# Patient Record
Sex: Female | Born: 1935
Health system: Southern US, Community
[De-identification: ages and names within clinical notes are randomized; demographics above are authoritative.]

## PROBLEM LIST (undated history)

## (undated) DIAGNOSIS — Z9889 Other specified postprocedural states: Secondary | ICD-10-CM

## (undated) DIAGNOSIS — G3184 Mild cognitive impairment, so stated: Secondary | ICD-10-CM

## (undated) DIAGNOSIS — I340 Nonrheumatic mitral (valve) insufficiency: Secondary | ICD-10-CM

## (undated) DIAGNOSIS — M1990 Unspecified osteoarthritis, unspecified site: Secondary | ICD-10-CM

## (undated) DIAGNOSIS — E049 Nontoxic goiter, unspecified: Secondary | ICD-10-CM

## (undated) DIAGNOSIS — M353 Polymyalgia rheumatica: Secondary | ICD-10-CM

## (undated) DIAGNOSIS — R413 Other amnesia: Secondary | ICD-10-CM

## (undated) DIAGNOSIS — I4819 Other persistent atrial fibrillation: Secondary | ICD-10-CM

## (undated) DIAGNOSIS — I350 Nonrheumatic aortic (valve) stenosis: Secondary | ICD-10-CM

## (undated) DIAGNOSIS — N952 Postmenopausal atrophic vaginitis: Secondary | ICD-10-CM

## (undated) DIAGNOSIS — E538 Deficiency of other specified B group vitamins: Secondary | ICD-10-CM

## (undated) DIAGNOSIS — R519 Headache, unspecified: Secondary | ICD-10-CM

## (undated) DIAGNOSIS — J45909 Unspecified asthma, uncomplicated: Secondary | ICD-10-CM

## (undated) DIAGNOSIS — I5189 Other ill-defined heart diseases: Secondary | ICD-10-CM

## (undated) DIAGNOSIS — R011 Cardiac murmur, unspecified: Secondary | ICD-10-CM

## (undated) DIAGNOSIS — G43101 Migraine with aura, not intractable, with status migrainosus: Secondary | ICD-10-CM

## (undated) HISTORY — DX: Mild cognitive impairment of uncertain or unknown etiology: G31.84

## (undated) HISTORY — DX: Other persistent atrial fibrillation: I48.19

## (undated) HISTORY — DX: Other amnesia: R41.3

## (undated) HISTORY — PX: ABDOMINAL HYSTERECTOMY: SHX81

## (undated) HISTORY — PX: LUMBAR DISC SURGERY: SHX700

## (undated) HISTORY — DX: Postmenopausal atrophic vaginitis: N95.2

## (undated) HISTORY — DX: Deficiency of other specified B group vitamins: E53.8

## (undated) HISTORY — PX: KNEE SURGERY: SHX244

## (undated) HISTORY — DX: Other specified postprocedural states: Z98.890

## (undated) HISTORY — DX: Polymyalgia rheumatica: M35.3

## (undated) HISTORY — DX: Nonrheumatic mitral (valve) insufficiency: I34.0

## (undated) HISTORY — DX: Nontoxic goiter, unspecified: E04.9

## (undated) HISTORY — DX: Other ill-defined heart diseases: I51.89

## (undated) HISTORY — PX: OTHER SURGICAL HISTORY: SHX169

## (undated) HISTORY — DX: Nonrheumatic aortic (valve) stenosis: I35.0

## (undated) HISTORY — PX: BREAST CYST EXCISION: SHX579

## (undated) HISTORY — PX: BREAST LUMPECTOMY: SHX2

## (undated) HISTORY — DX: Migraine with aura, not intractable, with status migrainosus: G43.101

## (undated) HISTORY — PX: LAPAROSCOPIC ENDOMETRIOSIS FULGURATION: SUR769

---

## 1998-06-03 ENCOUNTER — Emergency Department (HOSPITAL_COMMUNITY): Admission: EM | Admit: 1998-06-03 | Discharge: 1998-06-03 | Payer: Self-pay | Admitting: Emergency Medicine

## 1999-07-11 ENCOUNTER — Emergency Department (HOSPITAL_COMMUNITY): Admission: EM | Admit: 1999-07-11 | Discharge: 1999-07-11 | Payer: Self-pay | Admitting: *Deleted

## 1999-08-14 ENCOUNTER — Encounter: Payer: Self-pay | Admitting: Geriatric Medicine

## 1999-08-14 ENCOUNTER — Encounter: Admission: RE | Admit: 1999-08-14 | Discharge: 1999-08-14 | Payer: Self-pay | Admitting: Geriatric Medicine

## 1999-11-15 ENCOUNTER — Encounter: Payer: Self-pay | Admitting: Geriatric Medicine

## 1999-11-15 ENCOUNTER — Encounter: Admission: RE | Admit: 1999-11-15 | Discharge: 1999-11-15 | Payer: Self-pay | Admitting: Geriatric Medicine

## 2000-01-24 ENCOUNTER — Ambulatory Visit (HOSPITAL_COMMUNITY): Admission: RE | Admit: 2000-01-24 | Discharge: 2000-01-24 | Payer: Self-pay | Admitting: Orthopedic Surgery

## 2000-01-24 ENCOUNTER — Encounter: Payer: Self-pay | Admitting: Orthopedic Surgery

## 2000-09-16 ENCOUNTER — Encounter: Admission: RE | Admit: 2000-09-16 | Discharge: 2000-09-16 | Payer: Self-pay | Admitting: Geriatric Medicine

## 2000-09-16 ENCOUNTER — Encounter: Payer: Self-pay | Admitting: Geriatric Medicine

## 2000-10-29 ENCOUNTER — Encounter: Payer: Self-pay | Admitting: Orthopedic Surgery

## 2000-10-29 ENCOUNTER — Ambulatory Visit (HOSPITAL_COMMUNITY): Admission: RE | Admit: 2000-10-29 | Discharge: 2000-10-29 | Payer: Self-pay | Admitting: Orthopedic Surgery

## 2001-02-05 ENCOUNTER — Other Ambulatory Visit: Admission: RE | Admit: 2001-02-05 | Discharge: 2001-02-05 | Payer: Self-pay | Admitting: Obstetrics and Gynecology

## 2001-09-18 ENCOUNTER — Encounter: Admission: RE | Admit: 2001-09-18 | Discharge: 2001-09-18 | Payer: Self-pay | Admitting: Geriatric Medicine

## 2001-09-18 ENCOUNTER — Encounter: Payer: Self-pay | Admitting: Geriatric Medicine

## 2002-09-21 ENCOUNTER — Encounter: Admission: RE | Admit: 2002-09-21 | Discharge: 2002-09-21 | Payer: Self-pay | Admitting: Geriatric Medicine

## 2002-09-21 ENCOUNTER — Encounter: Payer: Self-pay | Admitting: Geriatric Medicine

## 2002-11-17 ENCOUNTER — Ambulatory Visit (HOSPITAL_COMMUNITY): Admission: RE | Admit: 2002-11-17 | Discharge: 2002-11-17 | Payer: Self-pay | Admitting: Gastroenterology

## 2003-07-28 ENCOUNTER — Encounter: Admission: RE | Admit: 2003-07-28 | Discharge: 2003-07-28 | Payer: Self-pay | Admitting: General Surgery

## 2003-08-03 ENCOUNTER — Ambulatory Visit (HOSPITAL_BASED_OUTPATIENT_CLINIC_OR_DEPARTMENT_OTHER): Admission: RE | Admit: 2003-08-03 | Discharge: 2003-08-03 | Payer: Self-pay | Admitting: General Surgery

## 2003-08-17 ENCOUNTER — Ambulatory Visit (HOSPITAL_BASED_OUTPATIENT_CLINIC_OR_DEPARTMENT_OTHER): Admission: RE | Admit: 2003-08-17 | Discharge: 2003-08-17 | Payer: Self-pay | Admitting: General Surgery

## 2003-08-17 ENCOUNTER — Encounter (INDEPENDENT_AMBULATORY_CARE_PROVIDER_SITE_OTHER): Payer: Self-pay | Admitting: Specialist

## 2003-08-17 ENCOUNTER — Ambulatory Visit (HOSPITAL_COMMUNITY): Admission: RE | Admit: 2003-08-17 | Discharge: 2003-08-17 | Payer: Self-pay | Admitting: General Surgery

## 2004-08-02 ENCOUNTER — Encounter: Admission: RE | Admit: 2004-08-02 | Discharge: 2004-08-02 | Payer: Self-pay | Admitting: Obstetrics and Gynecology

## 2005-10-15 ENCOUNTER — Encounter: Admission: RE | Admit: 2005-10-15 | Discharge: 2005-10-15 | Payer: Self-pay | Admitting: Geriatric Medicine

## 2005-12-27 ENCOUNTER — Other Ambulatory Visit: Admission: RE | Admit: 2005-12-27 | Discharge: 2005-12-27 | Payer: Self-pay | Admitting: Obstetrics and Gynecology

## 2006-12-04 ENCOUNTER — Encounter: Admission: RE | Admit: 2006-12-04 | Discharge: 2006-12-04 | Payer: Self-pay | Admitting: Geriatric Medicine

## 2007-12-08 ENCOUNTER — Encounter: Admission: RE | Admit: 2007-12-08 | Discharge: 2007-12-08 | Payer: Self-pay | Admitting: Geriatric Medicine

## 2009-03-09 ENCOUNTER — Encounter: Admission: RE | Admit: 2009-03-09 | Discharge: 2009-03-09 | Payer: Self-pay | Admitting: Geriatric Medicine

## 2010-04-22 ENCOUNTER — Encounter: Payer: Self-pay | Admitting: Geriatric Medicine

## 2010-05-25 ENCOUNTER — Other Ambulatory Visit: Payer: Self-pay | Admitting: Geriatric Medicine

## 2010-05-25 DIAGNOSIS — Z1231 Encounter for screening mammogram for malignant neoplasm of breast: Secondary | ICD-10-CM

## 2010-06-05 ENCOUNTER — Ambulatory Visit
Admission: RE | Admit: 2010-06-05 | Discharge: 2010-06-05 | Disposition: A | Discharge status: Home / Self Care | Payer: BC Managed Care – PPO | Source: Ambulatory Visit | Attending: Geriatric Medicine | Admitting: Geriatric Medicine

## 2010-06-05 DIAGNOSIS — Z1231 Encounter for screening mammogram for malignant neoplasm of breast: Secondary | ICD-10-CM

## 2010-08-17 NOTE — Op Note (Signed)
NAME:  Lindsay Ware, Lindsay Ware                            ACCOUNT NO.:  [id number]   MEDICAL RECORD NO.:  [id number]                   PATIENT TYPE:  AMB   LOCATION:  DSC                                  FACILITY:  [facility]   PHYSICIAN:  Angelia Mould. Derrell Lolling, M.D.             DATE OF BIRTH:  1935-09-26   DATE OF PROCEDURE:  08/17/2003  DATE OF DISCHARGE:                                 OPERATIVE REPORT   PREOPERATIVE DIAGNOSIS:  Right breast mass.   POSTOPERATIVE DIAGNOSIS:  Right breast mass.   PROCEDURE:  Right breast biopsy.   SURGEON:  Angelia Mould. Derrell Lolling, M.D.   OPERATIVE INDICATIONS:  This is a 75 year old white female who has noticed a  lump in her right breast at the 9 o'clock position for some time.  This has  actually been infected and has drained in the past.  This will respond to  antibiotics.  Her mammograms are negative.  On exam, in the right breast at  the 9 o'clock position there is a 1.5 cm subcutaneous mass quite firm. This  suggests a benign cyst. It is not inflamed. She is brought to the operating  room for excision of this mass.   OPERATIVE TECHNIQUE:  The patient was placed supine on the operating table.  She was monitored and sedated by the anesthesia department. The right wrist  was prepped and draped in a sterile fashion.  Xylocaine 1% with epinephrine  was used as a local infiltration anesthetic.  At the 9 o'clock position of  the right breast laterally the mass was identified. A transverse, narrow,  elliptical incision was made.  The cyst and some of the superficial  subcutaneous tissue was excised and sent for routine histology.   The wound was copiously irrigated with saline.  Hemostasis was excellent and  achieved with electrocautery.  The skin was closed with a running  subcuticular suture of 4-0 Monocryl and Steri-Strips.  Clean bandages were  placed and the patient was taken to the recovery room in stable condition.   ESTIMATED BLOOD LOSS:  3 cc.   COMPLICATIONS:  None.   SPONGE NEEDLE AND INSTRUMENT COUNTS:  Correct.                                               Angelia Mould. Derrell Lolling, M.D.   HMI/MEDQ  D:  08/17/2003  T:  08/17/2003  Job:  [id number]   cc:   Hal T. Stoneking, M.D.  [address] [place], [place] [address]  Fax: [phone]

## 2010-08-17 NOTE — Op Note (Signed)
   NAMEDUNG, Lindsay Ware                            ACCOUNT NO.:  [id number]   MEDICAL RECORD NO.:  [id number]                   PATIENT TYPE:  AMB   LOCATION:  ENDO                                 FACILITY:  [facility]   PHYSICIAN:  Danise Edge, M.D.                DATE OF BIRTH:  1935-12-24   DATE OF PROCEDURE:  11/17/2002  DATE OF DISCHARGE:                                 OPERATIVE REPORT   PROCEDURE:  Screening colonoscopy.   INDICATIONS FOR PROCEDURE:  Lindsay Ware is a 75 year old female born  Aug 18, 1935. Lindsay Ware is scheduled to undergo her first screening  colonoscopy with polypectomy to prevent colon cancer.   ENDOSCOPIST:  Charolett Bumpers, M.D.   PREMEDICATION:  Versed 7 mg, Demerol 60 mg .   DESCRIPTION OF PROCEDURE:  After obtaining informed consent, Lindsay Ware was  placed in the left lateral decubitus position. I administered intravenous  Demerol and intravenous Versed to achieve conscious sedation for the  procedure. The patient's blood pressure, oxygen saturation and cardiac  rhythm were monitored throughout the procedure and documented in the medical  record.   Anal inspection was normal. Digital rectal exam was normal. The Olympus  pediatric adjustable colonoscope was introduced into the rectum and advanced  to the cecum. Colonic preparation for the exam today was satisfactory.   RECTUM:  Normal.   SIGMOID COLON AND DESCENDING COLON:  Normal.   SPLENIC FLEXURE:  Normal.   TRANSVERSE COLON:  Normal.   HEPATIC FLEXURE:  Normal.   ASCENDING COLON:  Normal.   CECUM AND ILEOCECAL VALVE:  Normal.    ASSESSMENT:  Normal screening proctocolonoscopy to the cecum. No endoscopic  evidence for the presence of colorectal neoplasia.                                               Danise Edge, M.D.    MJ/MEDQ  D:  11/17/2002  T:  11/17/2002  Job:  [id number]   cc:   Hal T. Stoneking, M.D.  [address] [place], [place] [address]  Fax: [phone]

## 2010-08-20 ENCOUNTER — Other Ambulatory Visit: Payer: Self-pay | Admitting: Geriatric Medicine

## 2010-08-20 DIAGNOSIS — E049 Nontoxic goiter, unspecified: Secondary | ICD-10-CM

## 2010-08-21 ENCOUNTER — Ambulatory Visit
Admission: RE | Admit: 2010-08-21 | Discharge: 2010-08-21 | Disposition: A | Discharge status: Home / Self Care | Payer: BC Managed Care – PPO | Source: Ambulatory Visit | Attending: Geriatric Medicine | Admitting: Geriatric Medicine

## 2010-08-21 DIAGNOSIS — E049 Nontoxic goiter, unspecified: Secondary | ICD-10-CM

## 2011-05-08 ENCOUNTER — Other Ambulatory Visit: Payer: Self-pay | Admitting: Dermatology

## 2011-06-21 ENCOUNTER — Other Ambulatory Visit: Payer: Self-pay | Admitting: Geriatric Medicine

## 2011-06-21 DIAGNOSIS — Z1231 Encounter for screening mammogram for malignant neoplasm of breast: Secondary | ICD-10-CM

## 2011-06-26 ENCOUNTER — Ambulatory Visit: Payer: BC Managed Care – PPO

## 2011-06-27 ENCOUNTER — Ambulatory Visit: Payer: BC Managed Care – PPO

## 2011-07-01 ENCOUNTER — Ambulatory Visit: Payer: BC Managed Care – PPO

## 2011-07-04 ENCOUNTER — Ambulatory Visit
Admission: RE | Admit: 2011-07-04 | Discharge: 2011-07-04 | Disposition: A | Discharge status: Home / Self Care | Payer: BC Managed Care – PPO | Source: Ambulatory Visit | Attending: Geriatric Medicine | Admitting: Geriatric Medicine

## 2011-07-04 DIAGNOSIS — Z1231 Encounter for screening mammogram for malignant neoplasm of breast: Secondary | ICD-10-CM

## 2011-09-16 ENCOUNTER — Other Ambulatory Visit: Payer: Self-pay | Admitting: Geriatric Medicine

## 2011-09-16 DIAGNOSIS — E049 Nontoxic goiter, unspecified: Secondary | ICD-10-CM

## 2011-09-18 ENCOUNTER — Other Ambulatory Visit: Payer: BC Managed Care – PPO

## 2011-09-23 ENCOUNTER — Ambulatory Visit
Admission: RE | Admit: 2011-09-23 | Discharge: 2011-09-23 | Disposition: A | Discharge status: Home / Self Care | Payer: BC Managed Care – PPO | Source: Ambulatory Visit | Attending: Geriatric Medicine | Admitting: Geriatric Medicine

## 2011-09-23 DIAGNOSIS — E049 Nontoxic goiter, unspecified: Secondary | ICD-10-CM

## 2012-08-25 ENCOUNTER — Ambulatory Visit: Payer: Self-pay | Admitting: Podiatry

## 2012-09-21 ENCOUNTER — Other Ambulatory Visit: Payer: Self-pay | Admitting: Geriatric Medicine

## 2012-09-21 DIAGNOSIS — E049 Nontoxic goiter, unspecified: Secondary | ICD-10-CM

## 2012-09-25 ENCOUNTER — Ambulatory Visit
Admission: RE | Admit: 2012-09-25 | Discharge: 2012-09-25 | Disposition: A | Discharge status: Home / Self Care | Payer: BC Managed Care – PPO | Source: Ambulatory Visit | Attending: Geriatric Medicine | Admitting: Geriatric Medicine

## 2012-09-25 DIAGNOSIS — E049 Nontoxic goiter, unspecified: Secondary | ICD-10-CM

## 2012-11-02 ENCOUNTER — Other Ambulatory Visit: Payer: Self-pay

## 2012-11-02 DIAGNOSIS — Z1231 Encounter for screening mammogram for malignant neoplasm of breast: Secondary | ICD-10-CM

## 2012-11-05 ENCOUNTER — Ambulatory Visit
Admission: RE | Admit: 2012-11-05 | Discharge: 2012-11-05 | Disposition: A | Discharge status: Home / Self Care | Payer: BC Managed Care – PPO | Source: Ambulatory Visit

## 2012-11-05 DIAGNOSIS — Z1231 Encounter for screening mammogram for malignant neoplasm of breast: Secondary | ICD-10-CM

## 2013-07-08 ENCOUNTER — Other Ambulatory Visit: Payer: Self-pay | Admitting: Gastroenterology

## 2013-08-31 ENCOUNTER — Encounter: Payer: Self-pay | Admitting: Podiatry

## 2013-08-31 ENCOUNTER — Ambulatory Visit (INDEPENDENT_AMBULATORY_CARE_PROVIDER_SITE_OTHER): Payer: BC Managed Care – PPO | Admitting: Podiatry

## 2013-08-31 VITALS — BP 138/72 | HR 57 | Ht 67.0 in | Wt 146.0 lb

## 2013-08-31 DIAGNOSIS — B351 Tinea unguium: Secondary | ICD-10-CM | POA: Insufficient documentation

## 2013-08-31 DIAGNOSIS — M79606 Pain in leg, unspecified: Secondary | ICD-10-CM | POA: Insufficient documentation

## 2013-08-31 DIAGNOSIS — M79609 Pain in unspecified limb: Secondary | ICD-10-CM

## 2013-08-31 DIAGNOSIS — L03039 Cellulitis of unspecified toe: Secondary | ICD-10-CM | POA: Insufficient documentation

## 2013-08-31 MED ORDER — TERBINAFINE HCL 250 MG PO TABS: 250.0000 mg | ORAL_TABLET | Freq: Every day | ORAL | Status: DC

## 2013-08-31 NOTE — Patient Instructions (Signed)
Seen for painful infected nail 3rd right and hypertrophic nail.  Nail avulsed 3rd right. Soak in Epson salt water till the nail gets dry.  Take Lamisil for the next 3 weeks.  Return in 3 month or sooner if needed.

## 2013-08-31 NOTE — Progress Notes (Signed)
Subjective: 78 year old female presents with painful nail 3rd right. Has been hurting for several days.  Also having problem with discolored and hypertrophic nails.   Objective: Discolored hypertrophic and inflamed nail 3rd right. Thick dystrophic nail with fungal debris both great toes. Neurovascular status are within normal.  Assessment: Infected nail right 3rd. Mycotic nails both great toes. Painful feet.  Plan: Reviewed findings. Right 3rd toe nail avulsed. Pus drained. Cleansed with Iodine and Amerigel dressing applied. Soaking instruction given. All other nails debrided. Stay in Lamisil for 3 weeks.

## 2013-09-22 ENCOUNTER — Other Ambulatory Visit: Payer: Self-pay | Admitting: Geriatric Medicine

## 2013-09-22 DIAGNOSIS — E049 Nontoxic goiter, unspecified: Secondary | ICD-10-CM

## 2013-09-23 ENCOUNTER — Ambulatory Visit
Admission: RE | Admit: 2013-09-23 | Discharge: 2013-09-23 | Disposition: A | Discharge status: Home / Self Care | Payer: 59 | Source: Ambulatory Visit | Attending: Geriatric Medicine | Admitting: Geriatric Medicine

## 2013-09-23 DIAGNOSIS — E049 Nontoxic goiter, unspecified: Secondary | ICD-10-CM

## 2013-10-18 ENCOUNTER — Other Ambulatory Visit: Payer: Self-pay

## 2013-10-18 DIAGNOSIS — Z1231 Encounter for screening mammogram for malignant neoplasm of breast: Secondary | ICD-10-CM

## 2013-11-08 ENCOUNTER — Ambulatory Visit: Payer: BC Managed Care – PPO

## 2013-11-12 ENCOUNTER — Ambulatory Visit
Admission: RE | Admit: 2013-11-12 | Discharge: 2013-11-12 | Disposition: A | Discharge status: Home / Self Care | Payer: 59 | Source: Ambulatory Visit

## 2013-11-12 DIAGNOSIS — Z1231 Encounter for screening mammogram for malignant neoplasm of breast: Secondary | ICD-10-CM

## 2013-12-01 ENCOUNTER — Ambulatory Visit: Payer: PRIVATE HEALTH INSURANCE | Admitting: Podiatry

## 2013-12-09 ENCOUNTER — Ambulatory Visit: Payer: PRIVATE HEALTH INSURANCE

## 2013-12-21 ENCOUNTER — Encounter: Payer: Self-pay | Admitting: Podiatry

## 2013-12-21 ENCOUNTER — Ambulatory Visit (INDEPENDENT_AMBULATORY_CARE_PROVIDER_SITE_OTHER): Payer: PRIVATE HEALTH INSURANCE | Admitting: Podiatry

## 2013-12-21 VITALS — BP 131/64 | HR 66

## 2013-12-21 DIAGNOSIS — M79609 Pain in unspecified limb: Secondary | ICD-10-CM

## 2013-12-21 DIAGNOSIS — M79606 Pain in leg, unspecified: Secondary | ICD-10-CM

## 2013-12-21 DIAGNOSIS — B351 Tinea unguium: Secondary | ICD-10-CM

## 2013-12-21 NOTE — Progress Notes (Signed)
Subjective: 78 year old female presents with painful nail 3rd right. Has been hurting for several days.  Also having problem with discolored and hypertrophic nails.   Objective: Discolored hypertrophic and inflamed nail 3rd right. Thick dystrophic nail with fungal debris both great toes. Neurovascular status are within normal.  Assessment: Infected nail right 3rd. Mycotic nails both great toes. Painful feet.  Plan: Reviewed findings. Right 3rd toe nail avulsed. Pus drained. Cleansed with Iodine and Amerigel dressing applied. Soaking instruction given. All other nails debrided. Stay in Lamisil for 3 weeks.   

## 2013-12-21 NOTE — Patient Instructions (Signed)
Seen for deformed nails. Affected nails debrided. Return in 3 months or as needed.

## 2014-03-22 ENCOUNTER — Ambulatory Visit: Payer: PRIVATE HEALTH INSURANCE | Admitting: Podiatry

## 2014-04-05 ENCOUNTER — Encounter: Payer: Self-pay | Admitting: Podiatry

## 2014-04-05 ENCOUNTER — Ambulatory Visit (INDEPENDENT_AMBULATORY_CARE_PROVIDER_SITE_OTHER): Payer: Medicare Other | Admitting: Podiatry

## 2014-04-05 VITALS — BP 113/66 | HR 65

## 2014-04-05 DIAGNOSIS — B351 Tinea unguium: Secondary | ICD-10-CM

## 2014-04-05 DIAGNOSIS — M79606 Pain in leg, unspecified: Secondary | ICD-10-CM

## 2014-04-05 NOTE — Patient Instructions (Signed)
Seen for hypertrophic nails. All nails debrided. Return in 3 months or as needed.  

## 2014-04-05 NOTE — Progress Notes (Signed)
Subjective:  79 year old female presents for routine foot care. Been caring for fungal nails and sees some improvement.   Objective: Discolored hypertrophic and inflamed nail 3rd right.  Thick dystrophic nail with fungal debris both great toes.  Neurovascular status are within normal.   Assessment: Mycotic nails both great toes.  Painful feet.   Plan: Reviewed findings.  All other nails debrided.

## 2014-07-05 ENCOUNTER — Ambulatory Visit: Payer: Medicare Other | Admitting: Podiatry

## 2014-09-28 ENCOUNTER — Other Ambulatory Visit: Payer: Self-pay | Admitting: Geriatric Medicine

## 2014-09-28 DIAGNOSIS — E049 Nontoxic goiter, unspecified: Secondary | ICD-10-CM

## 2014-10-05 ENCOUNTER — Ambulatory Visit
Admission: RE | Admit: 2014-10-05 | Discharge: 2014-10-05 | Disposition: A | Discharge status: Home / Self Care | Payer: Medicare Other | Source: Ambulatory Visit | Attending: Geriatric Medicine | Admitting: Geriatric Medicine

## 2014-10-05 DIAGNOSIS — E049 Nontoxic goiter, unspecified: Secondary | ICD-10-CM

## 2014-10-12 ENCOUNTER — Other Ambulatory Visit: Payer: Self-pay | Admitting: Geriatric Medicine

## 2014-10-12 ENCOUNTER — Ambulatory Visit: Payer: Medicare Other | Admitting: Podiatry

## 2014-10-12 DIAGNOSIS — E049 Nontoxic goiter, unspecified: Secondary | ICD-10-CM

## 2014-10-14 ENCOUNTER — Other Ambulatory Visit: Payer: Medicare Other

## 2014-10-28 ENCOUNTER — Ambulatory Visit (INDEPENDENT_AMBULATORY_CARE_PROVIDER_SITE_OTHER): Payer: Medicare Other | Admitting: Podiatry

## 2014-10-28 ENCOUNTER — Encounter: Payer: Self-pay | Admitting: Podiatry

## 2014-10-28 VITALS — BP 136/74 | HR 60

## 2014-10-28 DIAGNOSIS — M79673 Pain in unspecified foot: Secondary | ICD-10-CM

## 2014-10-28 DIAGNOSIS — M79606 Pain in leg, unspecified: Secondary | ICD-10-CM

## 2014-10-28 DIAGNOSIS — B351 Tinea unguium: Secondary | ICD-10-CM | POA: Diagnosis not present

## 2014-10-28 NOTE — Progress Notes (Signed)
Subjective:  79 year old female presents for routine foot care.   Objective: Discolored hypertrophic and inflamed nail 3rd right.  Thick dystrophic nail with fungal debris both great toes.  Neurovascular status are within normal.   Assessment: Mycotic nails both great toes.  Painful feet.   Plan: Reviewed findings.  All other nails debrided.

## 2014-10-28 NOTE — Patient Instructions (Signed)
Seen for hypertrophic nails. All nails debrided. Return in 3 months or as needed.  

## 2014-11-24 ENCOUNTER — Other Ambulatory Visit: Payer: Self-pay

## 2014-11-24 DIAGNOSIS — Z1231 Encounter for screening mammogram for malignant neoplasm of breast: Secondary | ICD-10-CM

## 2015-01-02 ENCOUNTER — Ambulatory Visit
Admission: RE | Admit: 2015-01-02 | Discharge: 2015-01-02 | Disposition: A | Discharge status: Home / Self Care | Payer: Medicare Other | Source: Ambulatory Visit

## 2015-01-02 DIAGNOSIS — Z1231 Encounter for screening mammogram for malignant neoplasm of breast: Secondary | ICD-10-CM

## 2015-04-21 ENCOUNTER — Ambulatory Visit (INDEPENDENT_AMBULATORY_CARE_PROVIDER_SITE_OTHER): Payer: Medicare Other | Admitting: Family Medicine

## 2015-04-21 VITALS — BP 144/80 | HR 67 | Temp 98.2°F | Resp 16 | Ht 66.0 in | Wt 146.2 lb

## 2015-04-21 DIAGNOSIS — J019 Acute sinusitis, unspecified: Secondary | ICD-10-CM | POA: Diagnosis not present

## 2015-04-21 MED ORDER — AMOXICILLIN-POT CLAVULANATE 875-125 MG PO TABS: 1.0000 | ORAL_TABLET | Freq: Two times a day (BID) | ORAL | Status: DC

## 2015-04-21 NOTE — Progress Notes (Signed)
Subjective:  By signing my name below, I, Rawaa Al Rifaie, attest that this documentation has been prepared under the direction and in the presence of Norberto Sorenson, MD.  Broadus John, Medical Scribe. 04/21/2015.  3:14 PM.    Patient ID: Lindsay Ware, female    DOB: 09-06-1935, 80 y.o.   MRN: [id number]  Chief Complaint  Patient presents with  . Sinusitis    drainage is yellow, runny nose x almost 2 weeks  . Cough    HPI HPI Comments: Lindsay Ware is a 80 y.o. female who presents to [facility] complaining of possible sinusitis, gradual onset about 9 days ago.  Pt reports that the symptoms started with rhinorrhea, that then developed into head congestion with yellow colored drainage,with ear ache, cough, and fatigue. She took [employer], Zyrtec, the Emergen C, and the neti pot. She denies sleep disturbance secondary to the symptoms. Pt states that she is currently on no other medications other than Vitamin D and some joint meds. She notes that she keeps active by going to the gym regularly.   Pt reports that she has been aware of her irregular heart beats and heart murmur, however she was not advised to take any actions for them.    Patient Active Problem List   Diagnosis Date Noted  . Onychia and paronychia of toe 08/31/2013  . Onychomycosis 08/31/2013  . Pain in lower limb 08/31/2013   History reviewed. No pertinent past medical history. History reviewed. No pertinent past surgical history. Allergies  Allergen Reactions  . Sulfa Antibiotics    Prior to Admission medications   Medication Sig Start Date Ware Date Taking? Authorizing Provider  cetirizine (ZYRTEC) 10 MG tablet Take 10 mg by mouth daily.   Yes Historical Provider, MD  cholecalciferol (VITAMIN D) 1000 units tablet Take 1,000 Units by mouth daily.   Yes Historical Provider, MD  dextromethorphan-guaiFENesin (MUCINEX DM) 30-600 MG 12hr tablet Take 1 tablet by mouth 2 (two) times daily.   Yes  Historical Provider, MD  fluticasone (FLONASE) 50 MCG/ACT nasal spray Place into both nostrils daily.   Yes Historical Provider, MD  Misc Natural Products (OSTEO BI-FLEX ADV DOUBLE ST) TABS Take by mouth 2 (two) times daily.   Yes Historical Provider, MD   Social History   Social History  . Marital Status: Married    Spouse Name: N/A  . Number of Children: N/A  . Years of Education: N/A   Occupational History  . Not on file.   Social History Main Topics  . Smoking status: Former Games developer  . Smokeless tobacco: Never Used  . Alcohol Use: 1.8 oz/week    0 Standard drinks or equivalent, 3 Glasses of wine per week  . Drug Use: No  . Sexual Activity: Not on file   Other Topics Concern  . Not on file   Social History Narrative     Review of Systems  Constitutional: Positive for fatigue. Negative for fever, activity change and appetite change.  HENT: Positive for congestion, ear pain, postnasal drip, rhinorrhea and sinus pressure.   Respiratory: Positive for cough. Negative for shortness of breath and wheezing.   Gastrointestinal: Negative for vomiting.  Neurological: Positive for headaches.  Hematological: Negative for adenopathy.  Psychiatric/Behavioral: Negative for sleep disturbance.      Objective:   Physical Exam  Constitutional: She is oriented to person, place, and time. She appears well-developed and well-nourished. No distress.  HENT:  Head: Normocephalic and  atraumatic.  Right Ear: External ear normal.  Left Ear: External ear normal.  Nose: Nose normal.  Mouth/Throat: Posterior oropharyngeal edema (mild) present.  nares are a little edematous  Eyes: EOM are normal. Pupils are equal, round, and reactive to light.  Neck: Neck supple.  Question of a thyroid goiter.  Cardiovascular: Normal rate.  An irregular rhythm present.  Murmur (2/6, left upper sternal border) heard. Pulmonary/Chest: Effort normal.  Neurological: She is alert and oriented to person, place, and  time. No cranial nerve deficit.  Skin: Skin is warm and dry.  Psychiatric: She has a normal mood and affect. Her behavior is normal.  Nursing note and vitals reviewed.  BP 144/80 mmHg  Pulse 67  Temp(Src) 98.2 F (36.8 C) (Oral)  Resp 16  Ht  (1.676 m)  Wt 146 lb 3.2 oz (66.316 kg)  BMI 23.61 kg/m2  SpO2 98%     Assessment & Plan:   1. Acute sinusitis, recurrence not specified, unspecified location     Meds ordered this encounter  Medications  . Misc Natural Products (OSTEO BI-FLEX ADV DOUBLE ST) TABS    Sig: Take by mouth 2 (two) times daily.  . cholecalciferol (VITAMIN D) 1000 units tablet    Sig: Take 1,000 Units by mouth daily.  Marland Kitchen DISCONTD: cetirizine (ZYRTEC) 10 MG tablet    Sig: Take 10 mg by mouth daily.  Marland Kitchen dextromethorphan-guaiFENesin (MUCINEX DM) 30-600 MG 12hr tablet    Sig: Take 1 tablet by mouth 2 (two) times daily.  . fluticasone (FLONASE) 50 MCG/ACT nasal spray    Sig: Place into both nostrils daily.  Marland Kitchen amoxicillin-clavulanate (AUGMENTIN) 875-125 MG tablet    Sig: Take 1 tablet by mouth 2 (two) times daily.    Dispense:  20 tablet    Refill:  0    I personally performed the services described in this documentation, which was scribed in my presence. The recorded information has been reviewed and considered, and addended by me as needed.  Norberto Sorenson, MD MPH

## 2015-04-21 NOTE — Patient Instructions (Signed)
Hot showers or breathing in steam may help loosen the congestion.  Using a netti pot or sinus rinse is also likely to help you feel better and keep this from progressing.  Use the nasal saline spray as needed throughout the day and use the fluticasone nasal spray every night before bed for at least 2 weeks.  I recommend augmenting generic mucinex to help you move out the congestion.  If no improvement or you are getting worse, come back as you might need a course of steroids but hopefully with all of the above, you can avoid it.  Sinusitis, Adult Sinusitis is redness, soreness, and inflammation of the paranasal sinuses. Paranasal sinuses are air pockets within the bones of your face. They are located beneath your eyes, in the middle of your forehead, and above your eyes. In healthy paranasal sinuses, mucus is able to drain out, and air is able to circulate through them by way of your nose. However, when your paranasal sinuses are inflamed, mucus and air can become trapped. This can allow bacteria and other germs to grow and cause infection. Sinusitis can develop quickly and last only a short time (acute) or continue over a long period (chronic). Sinusitis that lasts for more than 12 weeks is considered chronic. CAUSES Causes of sinusitis include:  Allergies.  Structural abnormalities, such as displacement of the cartilage that separates your nostrils (deviated septum), which can decrease the air flow through your nose and sinuses and affect sinus drainage.  Functional abnormalities, such as when the small hairs (cilia) that line your sinuses and help remove mucus do not work properly or are not present. SIGNS AND SYMPTOMS Symptoms of acute and chronic sinusitis are the same. The primary symptoms are pain and pressure around the affected sinuses. Other symptoms include:  Upper toothache.  Earache.  Headache.  Bad breath.  Decreased sense of smell and taste.  A cough, which worsens when you  are lying flat.  Fatigue.  Fever.  Thick drainage from your nose, which often is green and may contain pus (purulent).  Swelling and warmth over the affected sinuses. DIAGNOSIS Your health care provider will perform a physical exam. During your exam, your health care provider may perform any of the following to help determine if you have acute sinusitis or chronic sinusitis:  Look in your nose for signs of abnormal growths in your nostrils (nasal polyps).  Tap over the affected sinus to check for signs of infection.  View the inside of your sinuses using an imaging device that has a light attached (endoscope). If your health care provider suspects that you have chronic sinusitis, one or more of the following tests may be recommended:  Allergy tests.  Nasal culture. A sample of mucus is taken from your nose, sent to a lab, and screened for bacteria.  Nasal cytology. A sample of mucus is taken from your nose and examined by your health care provider to determine if your sinusitis is related to an allergy. TREATMENT Most cases of acute sinusitis are related to a viral infection and will resolve on their own within 10 days. Sometimes, medicines are prescribed to help relieve symptoms of both acute and chronic sinusitis. These may include pain medicines, decongestants, nasal steroid sprays, or saline sprays. However, for sinusitis related to a bacterial infection, your health care provider will prescribe antibiotic medicines. These are medicines that will help kill the bacteria causing the infection. Rarely, sinusitis is caused by a fungal infection. In these cases, your  health care provider will prescribe antifungal medicine. For some cases of chronic sinusitis, surgery is needed. Generally, these are cases in which sinusitis recurs more than 3 times per year, despite other treatments. HOME CARE INSTRUCTIONS  Drink plenty of water. Water helps thin the mucus so your sinuses can drain more  easily.  Use a humidifier.  Inhale steam 3-4 times a day (for example, sit in the bathroom with the shower running).  Apply a warm, moist washcloth to your face 3-4 times a day, or as directed by your health care provider.  Use saline nasal sprays to help moisten and clean your sinuses.  Take medicines only as directed by your health care provider.  If you were prescribed either an antibiotic or antifungal medicine, finish it all even if you start to feel better. SEEK IMMEDIATE MEDICAL CARE IF:  You have increasing pain or severe headaches.  You have nausea, vomiting, or drowsiness.  You have swelling around your face.  You have vision problems.  You have a stiff neck.  You have difficulty breathing.   This information is not intended to replace advice given to you by your health care provider. Make sure you discuss any questions you have with your health care provider.   Document Released: 03/18/2005 Document Revised: 04/08/2014 Document Reviewed: 04/02/2011 Elsevier Interactive Patient Education [employer].

## 2015-07-28 ENCOUNTER — Other Ambulatory Visit (HOSPITAL_COMMUNITY): Payer: Self-pay | Admitting: Nurse Practitioner

## 2015-07-28 ENCOUNTER — Ambulatory Visit (HOSPITAL_COMMUNITY)
Admission: RE | Admit: 2015-07-28 | Discharge: 2015-07-28 | Disposition: A | Discharge status: Home / Self Care | Payer: Medicare Other | Source: Ambulatory Visit | Attending: Vascular Surgery | Admitting: Vascular Surgery

## 2015-07-28 DIAGNOSIS — M79604 Pain in right leg: Secondary | ICD-10-CM | POA: Diagnosis present

## 2015-08-07 ENCOUNTER — Other Ambulatory Visit: Payer: Self-pay | Admitting: Vascular Surgery

## 2015-08-07 ENCOUNTER — Other Ambulatory Visit: Payer: Self-pay | Admitting: Nurse Practitioner

## 2015-08-07 DIAGNOSIS — M79604 Pain in right leg: Secondary | ICD-10-CM

## 2015-08-07 DIAGNOSIS — M79605 Pain in left leg: Principal | ICD-10-CM

## 2015-11-21 ENCOUNTER — Encounter (HOSPITAL_COMMUNITY): Payer: Medicare Other

## 2015-11-21 ENCOUNTER — Encounter: Payer: Medicare Other | Admitting: Vascular Surgery

## 2015-11-27 ENCOUNTER — Other Ambulatory Visit: Payer: Self-pay | Admitting: Geriatric Medicine

## 2015-11-27 DIAGNOSIS — Z1231 Encounter for screening mammogram for malignant neoplasm of breast: Secondary | ICD-10-CM

## 2015-11-30 ENCOUNTER — Other Ambulatory Visit (HOSPITAL_COMMUNITY): Payer: Medicare Other

## 2015-12-15 ENCOUNTER — Other Ambulatory Visit: Payer: Self-pay

## 2015-12-15 ENCOUNTER — Other Ambulatory Visit: Payer: Self-pay | Admitting: Geriatric Medicine

## 2015-12-15 ENCOUNTER — Ambulatory Visit (HOSPITAL_COMMUNITY): Payer: Medicare Other | Attending: Cardiology

## 2015-12-15 DIAGNOSIS — I340 Nonrheumatic mitral (valve) insufficiency: Secondary | ICD-10-CM

## 2015-12-15 DIAGNOSIS — Z87891 Personal history of nicotine dependence: Secondary | ICD-10-CM | POA: Diagnosis not present

## 2015-12-15 DIAGNOSIS — I700 Atherosclerosis of aorta: Secondary | ICD-10-CM | POA: Diagnosis not present

## 2015-12-15 DIAGNOSIS — I371 Nonrheumatic pulmonary valve insufficiency: Secondary | ICD-10-CM | POA: Diagnosis not present

## 2015-12-15 DIAGNOSIS — I341 Nonrheumatic mitral (valve) prolapse: Secondary | ICD-10-CM | POA: Insufficient documentation

## 2015-12-15 LAB — ECHOCARDIOGRAM COMPLETE
AOASC: 31 cm
AV Peak grad: 18 mmHg
AV VEL mean LVOT/AV: 0.37
AV pk vel: 215 cm/s
AVG: 10 mmHg
Ao pk vel: 0.4 m/s
CHL CUP DOP CALC LVOT VTI: 20.3 cm
CHL CUP PV REG GRAD DIAS: 6 mmHg
CHL CUP REG VEL DIAS: 122 cm/s
CHL CUP TV REG PEAK VELOCITY: 264 cm/s
DOP CAL AO MEAN VELOCITY: 151 cm/s
EERAT: 9.21
EWDT: 232 ms
FS: 37 % (ref 28–44)
IVS/LV PW RATIO, ED: 1.09
LA diam end sys: 46 mm
LA vol index: 44.6 mL/m2
LADIAMINDEX: 2.63 cm/m2
LASIZE: 46 mm
LAVOL: 78 mL
LAVOLA4C: 74 mL
LV e' LATERAL: 7.13 cm/s
LVEEAVG: 9.21
LVEEMED: 9.21
LVOT peak vel: 85.5 cm/s
LVOTVTI: 0.41 cm
MV Dec: 232
MV pk E vel: 65.7 m/s
MVPKAVEL: 62 m/s
PW: 9.58 mm — AB (ref 0.6–1.1)
S' Lateral: 14.5 cm/s
TDI e' lateral: 7.13
TDI e' medial: 7.13
TRMAXVEL: 264 cm/s
VTI: 50 cm

## 2015-12-18 ENCOUNTER — Other Ambulatory Visit: Payer: Self-pay | Admitting: Geriatric Medicine

## 2015-12-18 DIAGNOSIS — I340 Nonrheumatic mitral (valve) insufficiency: Secondary | ICD-10-CM

## 2016-01-08 ENCOUNTER — Ambulatory Visit
Admission: RE | Admit: 2016-01-08 | Discharge: 2016-01-08 | Disposition: A | Discharge status: Home / Self Care | Payer: Medicare Other | Source: Ambulatory Visit | Attending: Geriatric Medicine | Admitting: Geriatric Medicine

## 2016-01-08 DIAGNOSIS — Z1231 Encounter for screening mammogram for malignant neoplasm of breast: Secondary | ICD-10-CM

## 2016-01-29 ENCOUNTER — Encounter: Payer: Self-pay | Admitting: Podiatry

## 2016-01-29 ENCOUNTER — Ambulatory Visit (INDEPENDENT_AMBULATORY_CARE_PROVIDER_SITE_OTHER): Payer: Medicare Other | Admitting: Podiatry

## 2016-01-29 VITALS — BP 149/77 | HR 63

## 2016-01-29 DIAGNOSIS — M79671 Pain in right foot: Secondary | ICD-10-CM

## 2016-01-29 DIAGNOSIS — B351 Tinea unguium: Secondary | ICD-10-CM

## 2016-01-29 DIAGNOSIS — M79672 Pain in left foot: Secondary | ICD-10-CM | POA: Diagnosis not present

## 2016-01-29 NOTE — Patient Instructions (Signed)
Seen for hypertrophic nails. All nails debrided. Return in 3 months or as needed.  

## 2016-01-29 NOTE — Progress Notes (Signed)
Subjective:  80 year old female presents complaining of painful ingrown nails on both great toes. They are hurting even under bed sheets.  Objective: Discolored hypertrophic and dystrophic nails on both great toes. Neurovascular status are within normal.  No growth osseous deformities noted.  Assessment: Mycotic nails both great toes.  Painful feet.   Plan: Reviewed findings.  All other nails debrided.

## 2016-05-27 ENCOUNTER — Ambulatory Visit (INDEPENDENT_AMBULATORY_CARE_PROVIDER_SITE_OTHER): Payer: Medicare Other | Admitting: Podiatry

## 2016-05-27 ENCOUNTER — Encounter: Payer: Self-pay | Admitting: Podiatry

## 2016-05-27 VITALS — BP 141/69 | HR 62

## 2016-05-27 DIAGNOSIS — B351 Tinea unguium: Secondary | ICD-10-CM

## 2016-05-27 DIAGNOSIS — M79671 Pain in right foot: Secondary | ICD-10-CM

## 2016-05-27 DIAGNOSIS — M79672 Pain in left foot: Secondary | ICD-10-CM

## 2016-05-27 DIAGNOSIS — L600 Ingrowing nail: Secondary | ICD-10-CM | POA: Diagnosis not present

## 2016-05-27 NOTE — Progress Notes (Signed)
Subjective: 81 year old female presents complaining of painful ingrown nails on both great toes. They are hurting even under bed sheets.  Objective: Discolored hypertrophic and dystrophic nails on both great toes. Neurovascular status are within normal.  No growth osseous deformities noted.  Assessment: Mycotic and deformed nails both great toes.  Painful feet.   Plan: Reviewed findings.  All other nails debrided.

## 2016-05-27 NOTE — Patient Instructions (Addendum)
Seen for hypertrophic nails and calluses. All nails and calluses debrided. Return in 3 months or as needed.  

## 2016-12-18 ENCOUNTER — Other Ambulatory Visit: Payer: Self-pay | Admitting: Geriatric Medicine

## 2016-12-18 DIAGNOSIS — Z1231 Encounter for screening mammogram for malignant neoplasm of breast: Secondary | ICD-10-CM

## 2017-01-10 ENCOUNTER — Ambulatory Visit: Payer: Medicare Other

## 2017-01-28 ENCOUNTER — Ambulatory Visit
Admission: RE | Admit: 2017-01-28 | Discharge: 2017-01-28 | Disposition: A | Discharge status: Home / Self Care | Payer: Medicare Other | Source: Ambulatory Visit | Attending: Geriatric Medicine | Admitting: Geriatric Medicine

## 2017-01-28 DIAGNOSIS — Z1231 Encounter for screening mammogram for malignant neoplasm of breast: Secondary | ICD-10-CM

## 2017-05-30 ENCOUNTER — Other Ambulatory Visit: Payer: Self-pay | Admitting: Internal Medicine

## 2017-05-30 ENCOUNTER — Other Ambulatory Visit: Payer: Self-pay | Admitting: Geriatric Medicine

## 2017-05-30 DIAGNOSIS — R0989 Other specified symptoms and signs involving the circulatory and respiratory systems: Secondary | ICD-10-CM

## 2017-06-04 ENCOUNTER — Other Ambulatory Visit: Payer: Medicare Other

## 2017-06-30 ENCOUNTER — Ambulatory Visit
Admission: RE | Admit: 2017-06-30 | Discharge: 2017-06-30 | Disposition: A | Discharge status: Home / Self Care | Payer: Medicare Other | Source: Ambulatory Visit | Attending: Geriatric Medicine | Admitting: Geriatric Medicine

## 2017-06-30 DIAGNOSIS — R0989 Other specified symptoms and signs involving the circulatory and respiratory systems: Secondary | ICD-10-CM

## 2017-09-09 ENCOUNTER — Ambulatory Visit: Payer: Medicare Other | Admitting: Podiatry

## 2017-09-09 ENCOUNTER — Encounter: Payer: Self-pay | Admitting: Podiatry

## 2017-09-09 DIAGNOSIS — M79671 Pain in right foot: Secondary | ICD-10-CM

## 2017-09-09 DIAGNOSIS — M79672 Pain in left foot: Secondary | ICD-10-CM

## 2017-09-09 DIAGNOSIS — B351 Tinea unguium: Secondary | ICD-10-CM

## 2017-09-09 DIAGNOSIS — L600 Ingrowing nail: Secondary | ICD-10-CM | POA: Diagnosis not present

## 2017-09-09 NOTE — Progress Notes (Signed)
Subjective: 82 y.o. year old female patient presents complaining of painful nails. Patient requests toe nails trimmed. Patient is using Salsun blue shampoo for her feet.  Objective: Dermatologic: Thick yellow deformed nails with fungal debris. Cryptotic nails both great toes. Vascular: Pedal pulses are all palpable. Orthopedic: Bunion deformity bilateral. Neurologic: All epicritic and tactile sensations grossly intact.  Assessment: Dystrophic mycotic nails x 10. Ingrown hallucal nails. Painful nails.  Treatment: All mycotic nails debrided.  Return in 3 months or as needed.

## 2017-09-09 NOTE — Patient Instructions (Signed)
Seen for hypertrophic nails. All nails debrided. Return in 4 months or as needed.  

## 2018-01-13 ENCOUNTER — Ambulatory Visit: Payer: Medicare Other | Admitting: Podiatry

## 2018-01-20 ENCOUNTER — Other Ambulatory Visit: Payer: Self-pay | Admitting: Geriatric Medicine

## 2018-01-20 DIAGNOSIS — Z1231 Encounter for screening mammogram for malignant neoplasm of breast: Secondary | ICD-10-CM

## 2018-01-30 ENCOUNTER — Ambulatory Visit
Admission: RE | Admit: 2018-01-30 | Discharge: 2018-01-30 | Disposition: A | Discharge status: Home / Self Care | Payer: Medicare Other | Source: Ambulatory Visit | Attending: Geriatric Medicine | Admitting: Geriatric Medicine

## 2018-01-30 ENCOUNTER — Other Ambulatory Visit: Payer: Self-pay | Admitting: Geriatric Medicine

## 2018-01-30 DIAGNOSIS — M542 Cervicalgia: Secondary | ICD-10-CM

## 2018-03-03 ENCOUNTER — Ambulatory Visit
Admission: RE | Admit: 2018-03-03 | Discharge: 2018-03-03 | Disposition: A | Discharge status: Home / Self Care | Payer: Medicare Other | Source: Ambulatory Visit | Attending: Geriatric Medicine | Admitting: Geriatric Medicine

## 2018-03-03 DIAGNOSIS — Z1231 Encounter for screening mammogram for malignant neoplasm of breast: Secondary | ICD-10-CM

## 2018-05-13 ENCOUNTER — Ambulatory Visit: Payer: Medicare Other | Admitting: Podiatrist

## 2018-05-13 ENCOUNTER — Encounter: Payer: Self-pay | Admitting: Podiatrist

## 2018-05-13 VITALS — BP 130/67 | HR 62

## 2018-05-13 DIAGNOSIS — B351 Tinea unguium: Secondary | ICD-10-CM | POA: Diagnosis not present

## 2018-05-13 DIAGNOSIS — M79671 Pain in right foot: Secondary | ICD-10-CM

## 2018-05-13 DIAGNOSIS — M79672 Pain in left foot: Secondary | ICD-10-CM | POA: Diagnosis not present

## 2018-05-13 NOTE — Progress Notes (Signed)
  Chief Complaint  Patient presents with  . Nail Problem    Nail trim bilateral 1-5     HPI: Patient is 83 y.o. female who presents today for nail trim- she was seeing Dr. Raynald Kemp for routine care until her office closed.  She has no complaints other than her toenails at todays visit.    Review of Systems  DATA OBTAINED: from patient  GENERAL: Feels well no fevers, no fatigue, no changes in appetite SKIN: No itching, no rashes, no open wounds EYES: No eye pain,no redness, no discharge EARS: No earache,no ringing of ears, NOSE: No congestion, no drainage, no bleeding  MOUTH/THROAT: No mouth pain, No sore throat, No difficulty chewing or swallowing  RESPIRATORY: No cough, no wheezing, no SOB CARDIAC: No chest pain,no heart palpitations, GI: No abdominal pain, No Nausea, no vomiting, no diarrhea, no heartburn or no reflux  GU: No dysuria, no increased frequency or urgency MUSCULOSKELETAL: No unrelieved bone/joint pain,  NEUROLOGIC: Awake, alert, appropriate to situation, No change in mental status. PSYCHIATRIC: No overt anxiety or sadness.No behavior issue.      Physical Exam  GENERAL APPEARANCE: Alert, conversant. Appropriately groomed. No acute distress.  VASCULAR: Pedal pulses palpable DP and PT bilateral.  Capillary refill time is immediate to all digits,  Proximal to distal cooling it warm to warm.  Digital hair growth is present bilateral  NEUROLOGIC: sensation is intact epicritically and protectively to 5.07 monofilament at 5/5 sites bilateral.  Light touch is intact bilateral, vibratory sensation intact bilateral, achilles tendon reflex is intact bilateral.  MUSCULOSKELETAL: acceptable muscle strength, tone and stability bilateral.  Intrinsic muscluature intact bilateral.  Range of motion at ankle and first MPJ is normal bilateral. Rectus appearance of foot and digits noted bilateral.   DERMATOLOGIC: skin color and turger are within normal limits slight xerosis of dorsal skin.   No preulcerative lesions are seen, no interdigital maceration noted.  No open lesions present.  Digital nails elongated and brittle and discolored- hallux nails are thick with an ingrowing tendancy.     Assessment   Painful onychomycosis  Plan  Discussed etiology, pathology, conservative vs. Surgical therapies and at this time debridement of symptomatic toenails was recommended.  Onychoreduction of symptomatic toenails was performed without iatrogenic incident.  Patient was instructed on signs and symptoms of infection and was told to call immediately should any of these arise.

## 2018-08-10 ENCOUNTER — Ambulatory Visit: Payer: Medicare Other | Admitting: Podiatrist

## 2018-09-25 ENCOUNTER — Other Ambulatory Visit: Payer: Self-pay

## 2018-09-25 ENCOUNTER — Ambulatory Visit: Payer: Medicare Other | Admitting: Podiatry

## 2018-09-25 ENCOUNTER — Encounter: Payer: Self-pay | Admitting: Podiatry

## 2018-09-25 DIAGNOSIS — M79675 Pain in left toe(s): Secondary | ICD-10-CM | POA: Diagnosis not present

## 2018-09-25 DIAGNOSIS — B351 Tinea unguium: Secondary | ICD-10-CM | POA: Insufficient documentation

## 2018-09-25 DIAGNOSIS — M79674 Pain in right toe(s): Secondary | ICD-10-CM

## 2018-09-25 NOTE — Progress Notes (Signed)
Complaint:  Visit Type: Patient returns to my office for continued preventative foot care services. Complaint: Patient states" my nails have grown long and thick and become painful to walk and wear shoes". The patient presents for preventative foot care services. No changes to ROS  Podiatric Exam: Vascular: dorsalis pedis and posterior tibial pulses are palpable bilateral. Capillary return is immediate. Temperature gradient is WNL. Skin turgor WNL  Sensorium: Normal Semmes Weinstein monofilament test. Normal tactile sensation bilaterally. Nail Exam: Pt has thick disfigured discolored nails with subungual debris noted bilateral entire nail hallux through fifth toenails Ulcer Exam: There is no evidence of ulcer or pre-ulcerative changes or infection. Orthopedic Exam: Muscle tone and strength are WNL. No limitations in general ROM. No crepitus or effusions noted. Foot type and digits show no abnormalities  HAV  B/L and Tailors bunion  B/L. Skin: No Porokeratosis. No infection or ulcers  Diagnosis:  Onychomycosis, , Pain in right toe, pain in left toes  Treatment & Plan Procedures and Treatment: Consent by patient was obtained for treatment procedures.   Debridement of mycotic and hypertrophic toenails, 1 through 5 bilateral and clearing of subungual debris. No ulceration, no infection noted.  Return Visit-Office Procedure: Patient instructed to return to the office for a follow up visit 3 months for continued evaluation and treatment.    Gardiner Barefoot DPM

## 2019-01-18 ENCOUNTER — Other Ambulatory Visit (HOSPITAL_COMMUNITY): Payer: Self-pay | Admitting: Geriatric Medicine

## 2019-01-18 DIAGNOSIS — I340 Nonrheumatic mitral (valve) insufficiency: Secondary | ICD-10-CM

## 2019-01-19 ENCOUNTER — Other Ambulatory Visit: Payer: Self-pay

## 2019-01-19 ENCOUNTER — Ambulatory Visit (HOSPITAL_COMMUNITY): Payer: Medicare Other | Attending: Cardiology

## 2019-01-19 DIAGNOSIS — I340 Nonrheumatic mitral (valve) insufficiency: Secondary | ICD-10-CM | POA: Diagnosis not present

## 2019-04-23 ENCOUNTER — Ambulatory Visit: Payer: Medicare PPO | Attending: Internal Medicine

## 2019-04-23 DIAGNOSIS — Z23 Encounter for immunization: Secondary | ICD-10-CM | POA: Insufficient documentation

## 2019-04-23 NOTE — Progress Notes (Signed)
   Covid-19 Vaccination Clinic  Name:  Lindsay Ware    MRN: [id number] DOB: [birth year]  04/23/2019  [name] was observed post Covid-19 immunization for 30 minutes based on pre-vaccination screening without incidence. She was provided with Vaccine Information Sheet and instruction to access the V-Safe system.   [name] was instructed to call 911 with any severe reactions post vaccine: Marland Kitchen Difficulty breathing  . Swelling of your face and throat  . A fast heartbeat  . A bad rash all over your body  . Dizziness and weakness    Immunizations Administered    Name Date Dose VIS Date Route   Pfizer COVID-19 Vaccine 04/23/2019  8:30 AM 0.3 mL 03/12/2019 Intramuscular   Manufacturer: [employer], [employer]   Lot: [id number]   NDC: [address]-2

## 2019-04-27 ENCOUNTER — Other Ambulatory Visit: Payer: Self-pay | Admitting: Geriatric Medicine

## 2019-04-27 DIAGNOSIS — Z1231 Encounter for screening mammogram for malignant neoplasm of breast: Secondary | ICD-10-CM

## 2019-05-14 ENCOUNTER — Ambulatory Visit: Payer: Medicare PPO | Attending: Internal Medicine

## 2019-05-14 DIAGNOSIS — Z23 Encounter for immunization: Secondary | ICD-10-CM | POA: Insufficient documentation

## 2019-05-14 NOTE — Progress Notes (Signed)
   Covid-19 Vaccination Clinic  Name:  Lindsay Ware    MRN: [id number] DOB: 02-10-1936  05/14/2019  [name] was observed post Covid-19 immunization for 30 minutes based on pre-vaccination screening without incidence. She was provided with Vaccine Information Sheet and instruction to access the V-Safe system.   [name] was instructed to call 911 with any severe reactions post vaccine: Marland Kitchen Difficulty breathing  . Swelling of your face and throat  . A fast heartbeat  . A bad rash all over your body  . Dizziness and weakness    Immunizations Administered    Name Date Dose VIS Date Route   Pfizer COVID-19 Vaccine 05/14/2019  8:40 AM 0.3 mL 03/12/2019 Intramuscular   Manufacturer: [employer], [employer]   Lot: [id number]   NDC: [address]-5

## 2019-06-03 ENCOUNTER — Ambulatory Visit: Payer: Medicare PPO

## 2019-07-02 ENCOUNTER — Ambulatory Visit
Admission: RE | Admit: 2019-07-02 | Discharge: 2019-07-02 | Disposition: A | Discharge status: Home / Self Care | Payer: Medicare PPO | Source: Ambulatory Visit | Attending: Geriatric Medicine | Admitting: Geriatric Medicine

## 2019-07-02 ENCOUNTER — Other Ambulatory Visit: Payer: Self-pay

## 2019-07-02 DIAGNOSIS — Z1231 Encounter for screening mammogram for malignant neoplasm of breast: Secondary | ICD-10-CM

## 2019-12-22 ENCOUNTER — Other Ambulatory Visit: Payer: Self-pay | Admitting: Geriatric Medicine

## 2019-12-22 DIAGNOSIS — R519 Headache, unspecified: Secondary | ICD-10-CM

## 2020-01-13 ENCOUNTER — Ambulatory Visit
Admission: RE | Admit: 2020-01-13 | Discharge: 2020-01-13 | Disposition: A | Discharge status: Home / Self Care | Payer: Medicare PPO | Source: Ambulatory Visit | Attending: Geriatric Medicine | Admitting: Geriatric Medicine

## 2020-01-13 DIAGNOSIS — R519 Headache, unspecified: Secondary | ICD-10-CM

## 2020-01-13 MED ORDER — GADOBENATE DIMEGLUMINE 529 MG/ML IV SOLN
12.0000 mL | Freq: Once | INTRAVENOUS | Status: AC | PRN
  Administered 2020-01-13: 12 mL via INTRAVENOUS

## 2020-02-03 DIAGNOSIS — Z23 Encounter for immunization: Secondary | ICD-10-CM | POA: Diagnosis not present

## 2020-02-03 DIAGNOSIS — E049 Nontoxic goiter, unspecified: Secondary | ICD-10-CM | POA: Diagnosis not present

## 2020-02-03 DIAGNOSIS — I350 Nonrheumatic aortic (valve) stenosis: Secondary | ICD-10-CM | POA: Diagnosis not present

## 2020-02-03 DIAGNOSIS — I340 Nonrheumatic mitral (valve) insufficiency: Secondary | ICD-10-CM | POA: Diagnosis not present

## 2020-02-03 DIAGNOSIS — G3184 Mild cognitive impairment, so stated: Secondary | ICD-10-CM | POA: Diagnosis not present

## 2020-02-03 DIAGNOSIS — Z1389 Encounter for screening for other disorder: Secondary | ICD-10-CM | POA: Diagnosis not present

## 2020-02-03 DIAGNOSIS — I519 Heart disease, unspecified: Secondary | ICD-10-CM | POA: Diagnosis not present

## 2020-02-03 DIAGNOSIS — G43109 Migraine with aura, not intractable, without status migrainosus: Secondary | ICD-10-CM | POA: Diagnosis not present

## 2020-02-03 DIAGNOSIS — Z79899 Other long term (current) drug therapy: Secondary | ICD-10-CM | POA: Diagnosis not present

## 2020-02-03 DIAGNOSIS — Z0000 Encounter for general adult medical examination without abnormal findings: Secondary | ICD-10-CM | POA: Diagnosis not present

## 2020-02-08 DIAGNOSIS — Z961 Presence of intraocular lens: Secondary | ICD-10-CM | POA: Diagnosis not present

## 2020-02-08 DIAGNOSIS — D3132 Benign neoplasm of left choroid: Secondary | ICD-10-CM | POA: Diagnosis not present

## 2020-02-08 DIAGNOSIS — H04123 Dry eye syndrome of bilateral lacrimal glands: Secondary | ICD-10-CM | POA: Diagnosis not present

## 2020-02-08 DIAGNOSIS — H4322 Crystalline deposits in vitreous body, left eye: Secondary | ICD-10-CM | POA: Diagnosis not present

## 2020-02-08 DIAGNOSIS — H10413 Chronic giant papillary conjunctivitis, bilateral: Secondary | ICD-10-CM | POA: Diagnosis not present

## 2020-02-08 DIAGNOSIS — R519 Headache, unspecified: Secondary | ICD-10-CM | POA: Diagnosis not present

## 2020-04-11 DIAGNOSIS — D0471 Carcinoma in situ of skin of right lower limb, including hip: Secondary | ICD-10-CM | POA: Diagnosis not present

## 2020-04-11 DIAGNOSIS — L821 Other seborrheic keratosis: Secondary | ICD-10-CM | POA: Diagnosis not present

## 2020-04-11 DIAGNOSIS — D485 Neoplasm of uncertain behavior of skin: Secondary | ICD-10-CM | POA: Diagnosis not present

## 2020-04-11 DIAGNOSIS — L723 Sebaceous cyst: Secondary | ICD-10-CM | POA: Diagnosis not present

## 2020-04-11 DIAGNOSIS — L570 Actinic keratosis: Secondary | ICD-10-CM | POA: Diagnosis not present

## 2020-04-11 DIAGNOSIS — L918 Other hypertrophic disorders of the skin: Secondary | ICD-10-CM | POA: Diagnosis not present

## 2020-04-11 DIAGNOSIS — L814 Other melanin hyperpigmentation: Secondary | ICD-10-CM | POA: Diagnosis not present

## 2020-04-11 DIAGNOSIS — Z85828 Personal history of other malignant neoplasm of skin: Secondary | ICD-10-CM | POA: Diagnosis not present

## 2020-04-24 DIAGNOSIS — R519 Headache, unspecified: Secondary | ICD-10-CM | POA: Diagnosis not present

## 2020-04-24 DIAGNOSIS — D492 Neoplasm of unspecified behavior of bone, soft tissue, and skin: Secondary | ICD-10-CM | POA: Diagnosis not present

## 2020-05-29 ENCOUNTER — Other Ambulatory Visit: Payer: Self-pay | Admitting: Geriatric Medicine

## 2020-05-29 DIAGNOSIS — Z1231 Encounter for screening mammogram for malignant neoplasm of breast: Secondary | ICD-10-CM

## 2020-06-05 ENCOUNTER — Other Ambulatory Visit: Payer: Self-pay

## 2020-06-05 ENCOUNTER — Ambulatory Visit: Payer: Medicare PPO | Admitting: Podiatry

## 2020-06-05 ENCOUNTER — Encounter: Payer: Self-pay | Admitting: Podiatry

## 2020-06-05 DIAGNOSIS — B351 Tinea unguium: Secondary | ICD-10-CM | POA: Diagnosis not present

## 2020-06-05 DIAGNOSIS — L600 Ingrowing nail: Secondary | ICD-10-CM | POA: Diagnosis not present

## 2020-06-05 DIAGNOSIS — M79674 Pain in right toe(s): Secondary | ICD-10-CM | POA: Diagnosis not present

## 2020-06-05 DIAGNOSIS — M79675 Pain in left toe(s): Secondary | ICD-10-CM

## 2020-06-05 NOTE — Patient Instructions (Signed)

## 2020-06-05 NOTE — Progress Notes (Signed)
Subjective:   Patient ID: Lindsay Ware, female   DOB: 85 y.o.   MRN: [id number]   HPI States that she has had chronic ingrown toenails and the one on the right foot has been bothering her quite a bit and all nails are somewhat bothersome and thickened   ROS      Objective:  Physical Exam  Neurovascular status intact with patient found to have incurvated medial border left hallux painful when pressed and also noted to have thick yellow brittle nails 1-5 both feet that are painful     Assessment:  Ingrown toenail deformity left hallux medial border with pain along with mycotic nail infection with pain 1-5 both feet     Plan:  H NP reviewed conditions and I went ahead today and discussed correction of the left toenail patient wants it done and explained procedure risk.  I infiltrated the left hallux 60 mg like Marcaine mixture sterile prep done and using sterile instrumentation remove the medial border exposed matrix and applied phenol 3 applications 30 seconds followed by alcohol lavage and sterile dressing.  Gave instructions on soaks and I also went ahead and debrided all remaining nails encouraged her to call with questions and to leave dressing on 24 hours but take it off earlier if any throbbing were to occur

## 2020-07-24 ENCOUNTER — Ambulatory Visit: Payer: Medicare PPO

## 2020-07-31 DIAGNOSIS — R768 Other specified abnormal immunological findings in serum: Secondary | ICD-10-CM | POA: Diagnosis not present

## 2020-07-31 DIAGNOSIS — M7701 Medial epicondylitis, right elbow: Secondary | ICD-10-CM | POA: Diagnosis not present

## 2020-07-31 DIAGNOSIS — M25521 Pain in right elbow: Secondary | ICD-10-CM | POA: Diagnosis not present

## 2020-08-29 DIAGNOSIS — M25521 Pain in right elbow: Secondary | ICD-10-CM | POA: Diagnosis not present

## 2020-10-09 DIAGNOSIS — L821 Other seborrheic keratosis: Secondary | ICD-10-CM | POA: Diagnosis not present

## 2020-10-09 DIAGNOSIS — Z85828 Personal history of other malignant neoplasm of skin: Secondary | ICD-10-CM | POA: Diagnosis not present

## 2020-11-06 ENCOUNTER — Other Ambulatory Visit: Payer: Self-pay | Admitting: Internal Medicine

## 2020-11-06 ENCOUNTER — Ambulatory Visit
Admission: RE | Admit: 2020-11-06 | Discharge: 2020-11-06 | Disposition: A | Discharge status: Home / Self Care | Payer: Medicare PPO | Source: Ambulatory Visit | Attending: Internal Medicine | Admitting: Internal Medicine

## 2020-11-06 ENCOUNTER — Other Ambulatory Visit: Payer: Self-pay

## 2020-11-06 DIAGNOSIS — M79672 Pain in left foot: Secondary | ICD-10-CM

## 2020-11-06 DIAGNOSIS — M7732 Calcaneal spur, left foot: Secondary | ICD-10-CM | POA: Diagnosis not present

## 2020-11-06 DIAGNOSIS — M7989 Other specified soft tissue disorders: Secondary | ICD-10-CM | POA: Diagnosis not present

## 2021-01-03 DIAGNOSIS — M1990 Unspecified osteoarthritis, unspecified site: Secondary | ICD-10-CM | POA: Diagnosis not present

## 2021-01-03 DIAGNOSIS — Z87892 Personal history of anaphylaxis: Secondary | ICD-10-CM | POA: Diagnosis not present

## 2021-01-03 DIAGNOSIS — G3184 Mild cognitive impairment, so stated: Secondary | ICD-10-CM | POA: Diagnosis not present

## 2021-01-03 DIAGNOSIS — Z809 Family history of malignant neoplasm, unspecified: Secondary | ICD-10-CM | POA: Diagnosis not present

## 2021-01-03 DIAGNOSIS — Z8249 Family history of ischemic heart disease and other diseases of the circulatory system: Secondary | ICD-10-CM | POA: Diagnosis not present

## 2021-01-03 DIAGNOSIS — Z87891 Personal history of nicotine dependence: Secondary | ICD-10-CM | POA: Diagnosis not present

## 2021-01-03 DIAGNOSIS — J45909 Unspecified asthma, uncomplicated: Secondary | ICD-10-CM | POA: Diagnosis not present

## 2021-01-03 DIAGNOSIS — Z791 Long term (current) use of non-steroidal anti-inflammatories (NSAID): Secondary | ICD-10-CM | POA: Diagnosis not present

## 2021-01-03 DIAGNOSIS — R32 Unspecified urinary incontinence: Secondary | ICD-10-CM | POA: Diagnosis not present

## 2021-02-07 DIAGNOSIS — Z79899 Other long term (current) drug therapy: Secondary | ICD-10-CM | POA: Diagnosis not present

## 2021-02-07 DIAGNOSIS — I519 Heart disease, unspecified: Secondary | ICD-10-CM | POA: Diagnosis not present

## 2021-02-07 DIAGNOSIS — Z1389 Encounter for screening for other disorder: Secondary | ICD-10-CM | POA: Diagnosis not present

## 2021-02-07 DIAGNOSIS — E049 Nontoxic goiter, unspecified: Secondary | ICD-10-CM | POA: Diagnosis not present

## 2021-02-07 DIAGNOSIS — I350 Nonrheumatic aortic (valve) stenosis: Secondary | ICD-10-CM | POA: Diagnosis not present

## 2021-02-07 DIAGNOSIS — Z0000 Encounter for general adult medical examination without abnormal findings: Secondary | ICD-10-CM | POA: Diagnosis not present

## 2021-02-07 DIAGNOSIS — I340 Nonrheumatic mitral (valve) insufficiency: Secondary | ICD-10-CM | POA: Diagnosis not present

## 2021-02-07 DIAGNOSIS — Z23 Encounter for immunization: Secondary | ICD-10-CM | POA: Diagnosis not present

## 2021-03-19 DIAGNOSIS — M25511 Pain in right shoulder: Secondary | ICD-10-CM | POA: Diagnosis not present

## 2021-03-23 DIAGNOSIS — M25511 Pain in right shoulder: Secondary | ICD-10-CM | POA: Diagnosis not present

## 2021-04-04 DIAGNOSIS — M62511 Muscle wasting and atrophy, not elsewhere classified, right shoulder: Secondary | ICD-10-CM | POA: Diagnosis not present

## 2021-04-04 DIAGNOSIS — M25511 Pain in right shoulder: Secondary | ICD-10-CM | POA: Diagnosis not present

## 2021-04-04 DIAGNOSIS — R293 Abnormal posture: Secondary | ICD-10-CM | POA: Diagnosis not present

## 2021-04-04 DIAGNOSIS — M25611 Stiffness of right shoulder, not elsewhere classified: Secondary | ICD-10-CM | POA: Diagnosis not present

## 2021-04-17 DIAGNOSIS — Z85828 Personal history of other malignant neoplasm of skin: Secondary | ICD-10-CM | POA: Diagnosis not present

## 2021-04-17 DIAGNOSIS — M25611 Stiffness of right shoulder, not elsewhere classified: Secondary | ICD-10-CM | POA: Diagnosis not present

## 2021-04-17 DIAGNOSIS — L723 Sebaceous cyst: Secondary | ICD-10-CM | POA: Diagnosis not present

## 2021-04-17 DIAGNOSIS — L570 Actinic keratosis: Secondary | ICD-10-CM | POA: Diagnosis not present

## 2021-04-17 DIAGNOSIS — M62511 Muscle wasting and atrophy, not elsewhere classified, right shoulder: Secondary | ICD-10-CM | POA: Diagnosis not present

## 2021-04-17 DIAGNOSIS — M25511 Pain in right shoulder: Secondary | ICD-10-CM | POA: Diagnosis not present

## 2021-04-17 DIAGNOSIS — L812 Freckles: Secondary | ICD-10-CM | POA: Diagnosis not present

## 2021-04-17 DIAGNOSIS — R293 Abnormal posture: Secondary | ICD-10-CM | POA: Diagnosis not present

## 2021-04-17 DIAGNOSIS — L821 Other seborrheic keratosis: Secondary | ICD-10-CM | POA: Diagnosis not present

## 2021-04-19 DIAGNOSIS — M25511 Pain in right shoulder: Secondary | ICD-10-CM | POA: Diagnosis not present

## 2021-04-19 DIAGNOSIS — M25611 Stiffness of right shoulder, not elsewhere classified: Secondary | ICD-10-CM | POA: Diagnosis not present

## 2021-04-19 DIAGNOSIS — M62511 Muscle wasting and atrophy, not elsewhere classified, right shoulder: Secondary | ICD-10-CM | POA: Diagnosis not present

## 2021-04-19 DIAGNOSIS — R293 Abnormal posture: Secondary | ICD-10-CM | POA: Diagnosis not present

## 2021-04-23 DIAGNOSIS — M25611 Stiffness of right shoulder, not elsewhere classified: Secondary | ICD-10-CM | POA: Diagnosis not present

## 2021-04-23 DIAGNOSIS — M62511 Muscle wasting and atrophy, not elsewhere classified, right shoulder: Secondary | ICD-10-CM | POA: Diagnosis not present

## 2021-04-23 DIAGNOSIS — R293 Abnormal posture: Secondary | ICD-10-CM | POA: Diagnosis not present

## 2021-04-23 DIAGNOSIS — M25511 Pain in right shoulder: Secondary | ICD-10-CM | POA: Diagnosis not present

## 2021-04-25 DIAGNOSIS — D3132 Benign neoplasm of left choroid: Secondary | ICD-10-CM | POA: Diagnosis not present

## 2021-04-25 DIAGNOSIS — D492 Neoplasm of unspecified behavior of bone, soft tissue, and skin: Secondary | ICD-10-CM | POA: Diagnosis not present

## 2021-04-25 DIAGNOSIS — R519 Headache, unspecified: Secondary | ICD-10-CM | POA: Diagnosis not present

## 2021-04-25 DIAGNOSIS — H04123 Dry eye syndrome of bilateral lacrimal glands: Secondary | ICD-10-CM | POA: Diagnosis not present

## 2021-04-25 DIAGNOSIS — H4322 Crystalline deposits in vitreous body, left eye: Secondary | ICD-10-CM | POA: Diagnosis not present

## 2021-04-25 DIAGNOSIS — Z961 Presence of intraocular lens: Secondary | ICD-10-CM | POA: Diagnosis not present

## 2021-04-25 DIAGNOSIS — H10413 Chronic giant papillary conjunctivitis, bilateral: Secondary | ICD-10-CM | POA: Diagnosis not present

## 2021-04-27 DIAGNOSIS — M62511 Muscle wasting and atrophy, not elsewhere classified, right shoulder: Secondary | ICD-10-CM | POA: Diagnosis not present

## 2021-04-27 DIAGNOSIS — M25611 Stiffness of right shoulder, not elsewhere classified: Secondary | ICD-10-CM | POA: Diagnosis not present

## 2021-04-27 DIAGNOSIS — R293 Abnormal posture: Secondary | ICD-10-CM | POA: Diagnosis not present

## 2021-04-27 DIAGNOSIS — M25511 Pain in right shoulder: Secondary | ICD-10-CM | POA: Diagnosis not present

## 2021-04-30 DIAGNOSIS — M25511 Pain in right shoulder: Secondary | ICD-10-CM | POA: Diagnosis not present

## 2021-04-30 DIAGNOSIS — M62511 Muscle wasting and atrophy, not elsewhere classified, right shoulder: Secondary | ICD-10-CM | POA: Diagnosis not present

## 2021-04-30 DIAGNOSIS — R293 Abnormal posture: Secondary | ICD-10-CM | POA: Diagnosis not present

## 2021-04-30 DIAGNOSIS — M25611 Stiffness of right shoulder, not elsewhere classified: Secondary | ICD-10-CM | POA: Diagnosis not present

## 2021-05-03 DIAGNOSIS — M25611 Stiffness of right shoulder, not elsewhere classified: Secondary | ICD-10-CM | POA: Diagnosis not present

## 2021-05-03 DIAGNOSIS — M25511 Pain in right shoulder: Secondary | ICD-10-CM | POA: Diagnosis not present

## 2021-05-03 DIAGNOSIS — R293 Abnormal posture: Secondary | ICD-10-CM | POA: Diagnosis not present

## 2021-05-03 DIAGNOSIS — M62511 Muscle wasting and atrophy, not elsewhere classified, right shoulder: Secondary | ICD-10-CM | POA: Diagnosis not present

## 2021-05-07 DIAGNOSIS — M25611 Stiffness of right shoulder, not elsewhere classified: Secondary | ICD-10-CM | POA: Diagnosis not present

## 2021-05-07 DIAGNOSIS — M25511 Pain in right shoulder: Secondary | ICD-10-CM | POA: Diagnosis not present

## 2021-05-07 DIAGNOSIS — R293 Abnormal posture: Secondary | ICD-10-CM | POA: Diagnosis not present

## 2021-05-07 DIAGNOSIS — M62511 Muscle wasting and atrophy, not elsewhere classified, right shoulder: Secondary | ICD-10-CM | POA: Diagnosis not present

## 2021-05-14 DIAGNOSIS — M25511 Pain in right shoulder: Secondary | ICD-10-CM | POA: Diagnosis not present

## 2021-05-14 DIAGNOSIS — M62511 Muscle wasting and atrophy, not elsewhere classified, right shoulder: Secondary | ICD-10-CM | POA: Diagnosis not present

## 2021-05-14 DIAGNOSIS — R293 Abnormal posture: Secondary | ICD-10-CM | POA: Diagnosis not present

## 2021-05-14 DIAGNOSIS — M25611 Stiffness of right shoulder, not elsewhere classified: Secondary | ICD-10-CM | POA: Diagnosis not present

## 2021-05-21 DIAGNOSIS — M62511 Muscle wasting and atrophy, not elsewhere classified, right shoulder: Secondary | ICD-10-CM | POA: Diagnosis not present

## 2021-05-21 DIAGNOSIS — R293 Abnormal posture: Secondary | ICD-10-CM | POA: Diagnosis not present

## 2021-05-21 DIAGNOSIS — M25611 Stiffness of right shoulder, not elsewhere classified: Secondary | ICD-10-CM | POA: Diagnosis not present

## 2021-05-21 DIAGNOSIS — M25511 Pain in right shoulder: Secondary | ICD-10-CM | POA: Diagnosis not present

## 2021-07-05 IMAGING — MG DIGITAL SCREENING BILAT W/ TOMO W/ CAD
8 series · 9 of 24 positions shown · non-contrast
Comparison: Previous exam(s).

CLINICAL DATA: Screening.

EXAM:
DIGITAL SCREENING BILATERAL MAMMOGRAM WITH TOMO AND CAD

[R CC synth-2D]
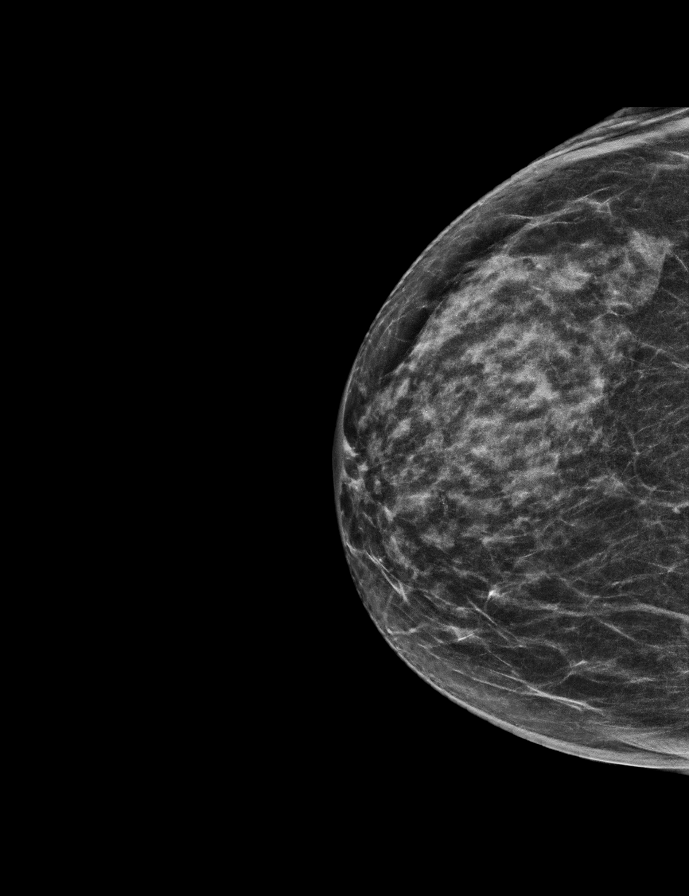

[R MLO synth-2D]
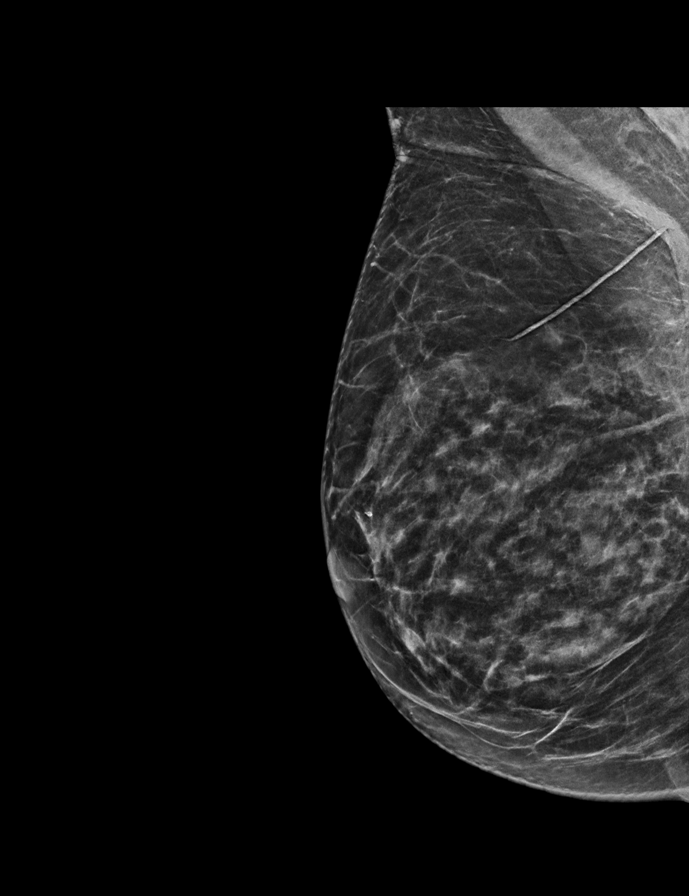

[L CC synth-2D]
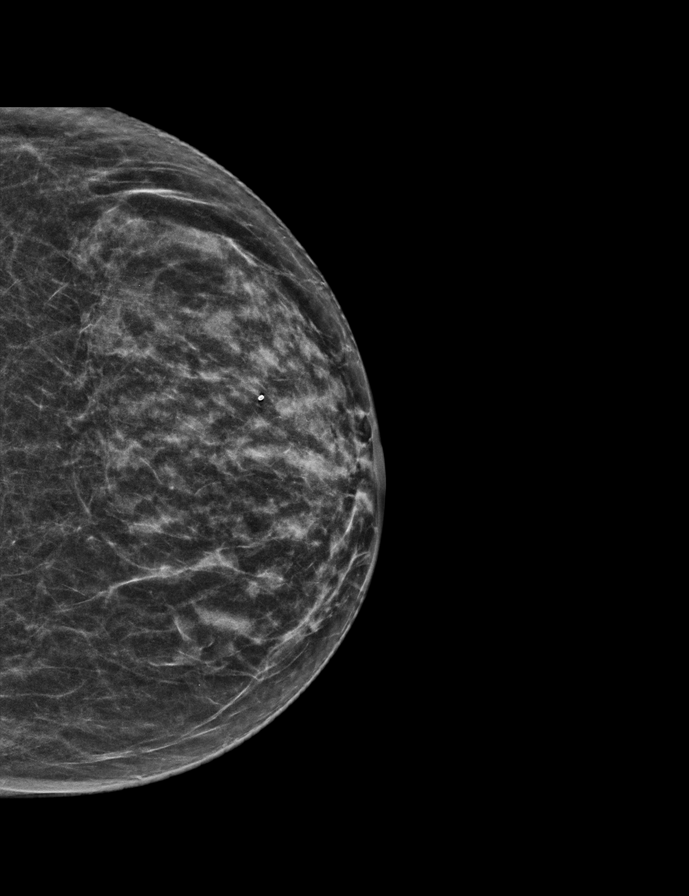

[L MLO synth-2D]
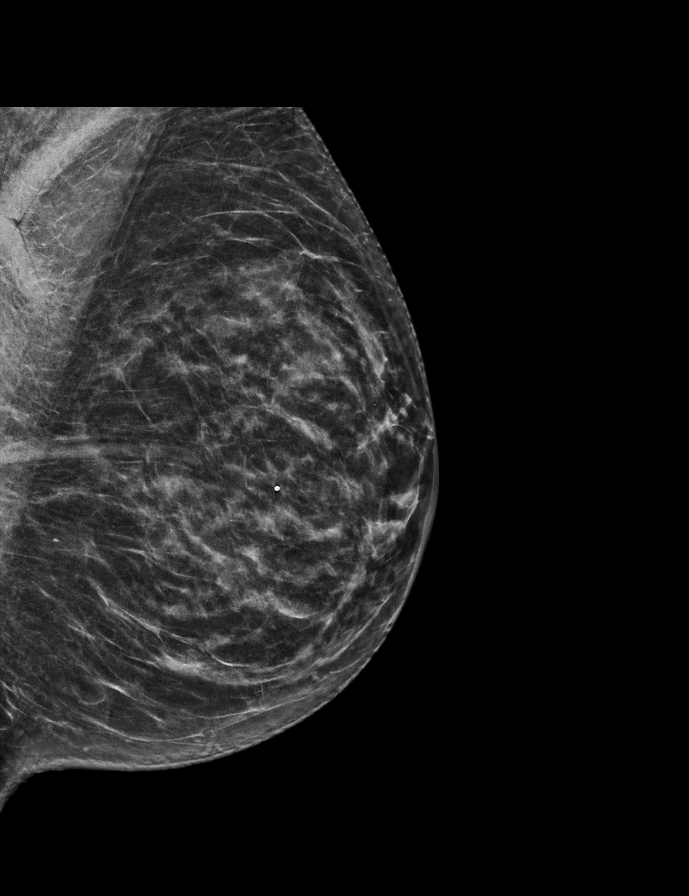

[L MLO tomo · 2 of 55 frames shown]
[frame 18/55]
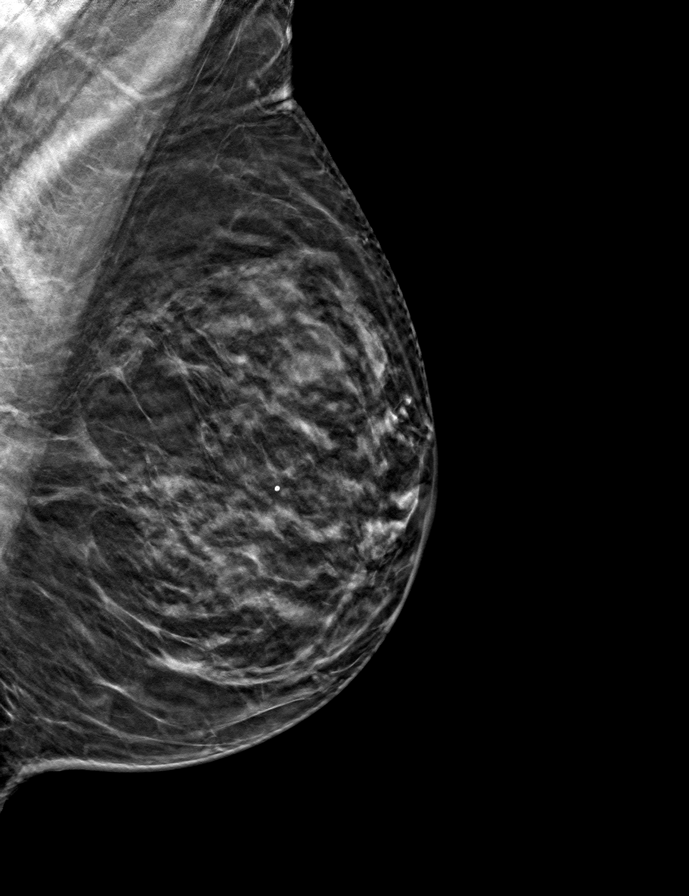
[frame 28/55]
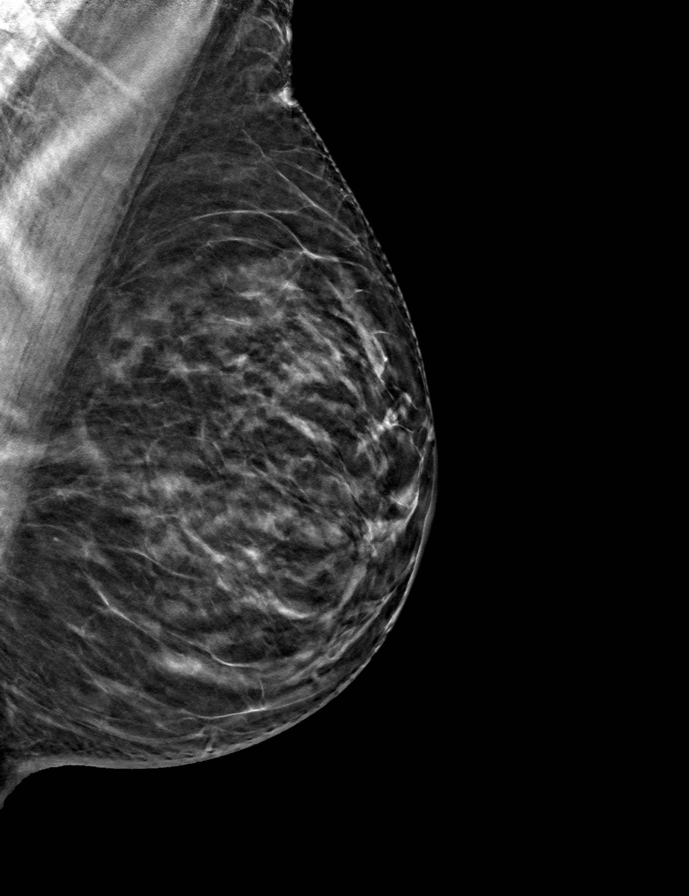

[R MLO tomo · tomo slice 27/54.0]
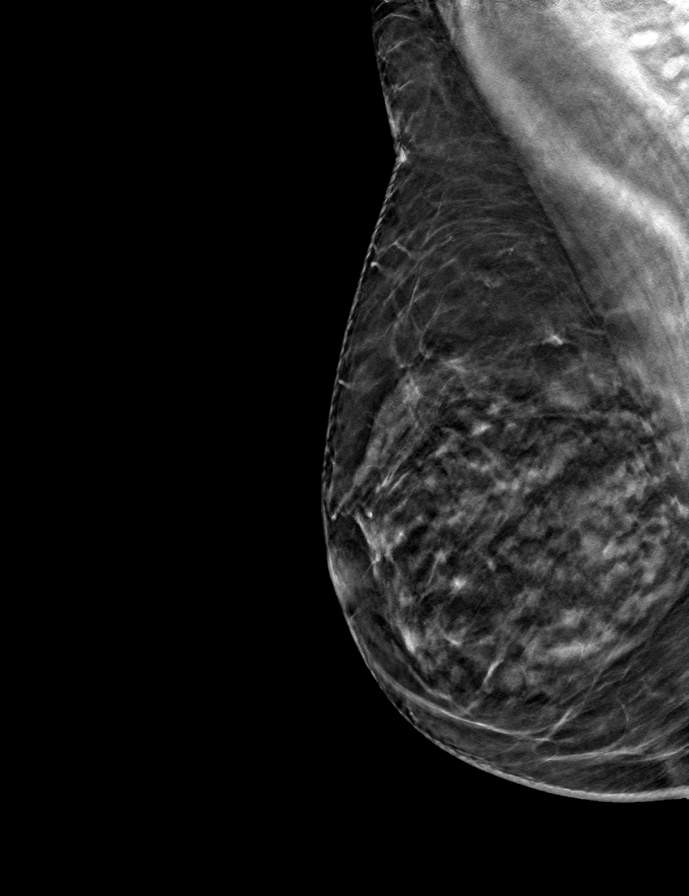

[R CC tomo · tomo slice 27/53.0]
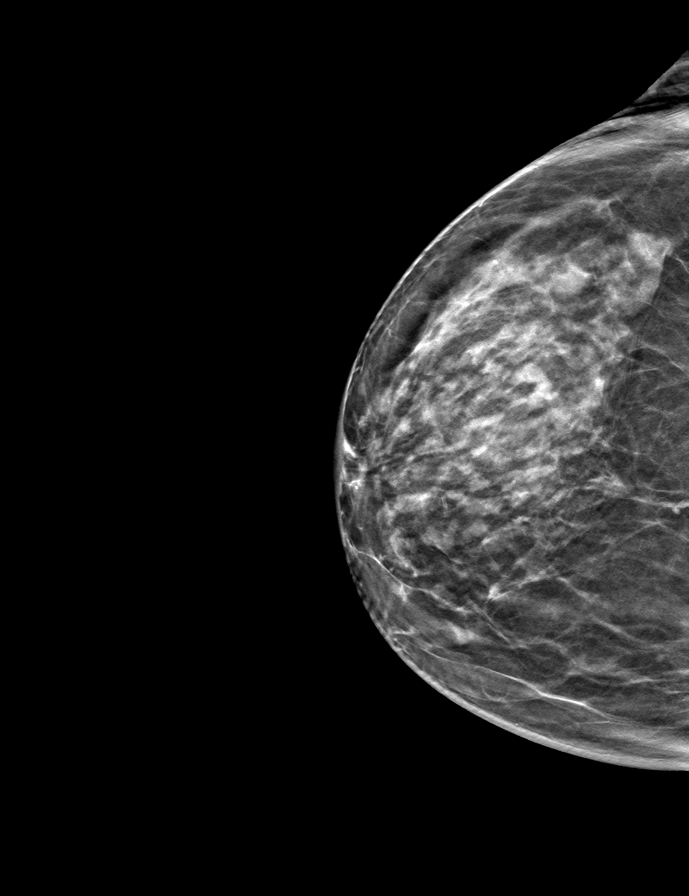

[L CC tomo · tomo slice 27/52.0]
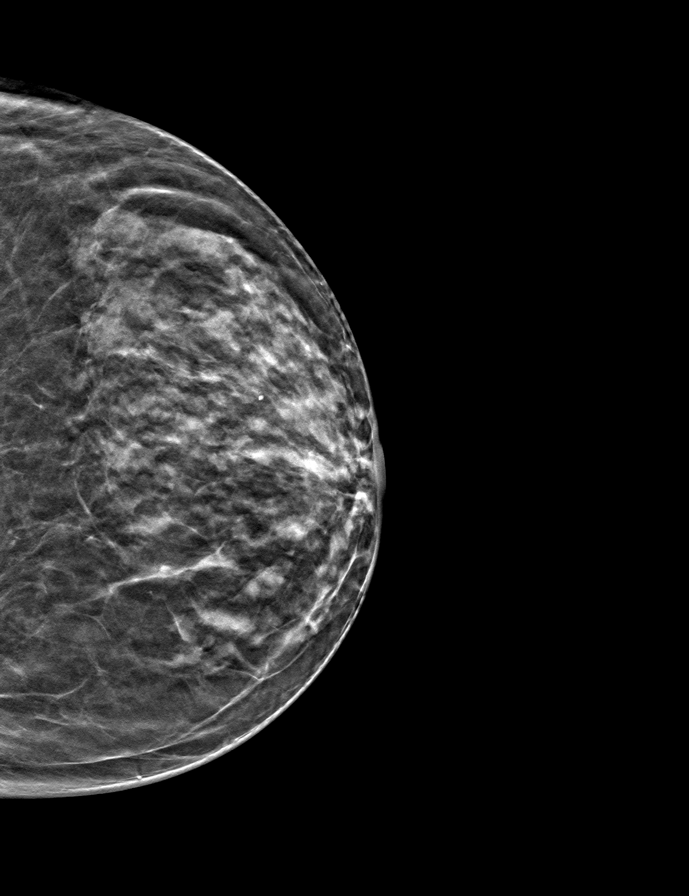

[9 of 24 positions shown; findings below may reference images not displayed]

ACR Breast Density Category c: The breast tissue is heterogeneously
dense, which may obscure small masses.
FINDINGS: There are no findings suspicious for malignancy. Images were
processed with CAD.
IMPRESSION: No mammographic evidence of malignancy. A result letter of this
screening mammogram will be mailed directly to the patient.

RECOMMENDATION:
Screening mammogram in one year. (Code:[id number]-U-LHB)

BI-RADS CATEGORY  1: Negative.

## 2021-09-10 DIAGNOSIS — N952 Postmenopausal atrophic vaginitis: Secondary | ICD-10-CM | POA: Diagnosis not present

## 2021-09-10 DIAGNOSIS — R102 Pelvic and perineal pain: Secondary | ICD-10-CM | POA: Diagnosis not present

## 2021-09-10 DIAGNOSIS — N898 Other specified noninflammatory disorders of vagina: Secondary | ICD-10-CM | POA: Diagnosis not present

## 2021-09-10 DIAGNOSIS — L298 Other pruritus: Secondary | ICD-10-CM | POA: Diagnosis not present

## 2021-09-10 DIAGNOSIS — N949 Unspecified condition associated with female genital organs and menstrual cycle: Secondary | ICD-10-CM | POA: Diagnosis not present

## 2021-09-10 DIAGNOSIS — R300 Dysuria: Secondary | ICD-10-CM | POA: Diagnosis not present

## 2021-10-12 DIAGNOSIS — R413 Other amnesia: Secondary | ICD-10-CM | POA: Diagnosis not present

## 2021-11-02 DIAGNOSIS — N952 Postmenopausal atrophic vaginitis: Secondary | ICD-10-CM | POA: Diagnosis not present

## 2021-11-06 DIAGNOSIS — L570 Actinic keratosis: Secondary | ICD-10-CM | POA: Diagnosis not present

## 2021-11-06 DIAGNOSIS — L821 Other seborrheic keratosis: Secondary | ICD-10-CM | POA: Diagnosis not present

## 2021-11-06 DIAGNOSIS — L720 Epidermal cyst: Secondary | ICD-10-CM | POA: Diagnosis not present

## 2021-11-06 DIAGNOSIS — Z85828 Personal history of other malignant neoplasm of skin: Secondary | ICD-10-CM | POA: Diagnosis not present

## 2021-11-20 DIAGNOSIS — N904 Leukoplakia of vulva: Secondary | ICD-10-CM | POA: Diagnosis not present

## 2021-11-20 DIAGNOSIS — N952 Postmenopausal atrophic vaginitis: Secondary | ICD-10-CM | POA: Diagnosis not present

## 2021-12-21 DIAGNOSIS — N952 Postmenopausal atrophic vaginitis: Secondary | ICD-10-CM | POA: Diagnosis not present

## 2021-12-21 DIAGNOSIS — N904 Leukoplakia of vulva: Secondary | ICD-10-CM | POA: Diagnosis not present

## 2022-02-25 DIAGNOSIS — G629 Polyneuropathy, unspecified: Secondary | ICD-10-CM | POA: Diagnosis not present

## 2022-02-25 DIAGNOSIS — Z0000 Encounter for general adult medical examination without abnormal findings: Secondary | ICD-10-CM | POA: Diagnosis not present

## 2022-02-25 DIAGNOSIS — Z23 Encounter for immunization: Secondary | ICD-10-CM | POA: Diagnosis not present

## 2022-02-25 DIAGNOSIS — I519 Heart disease, unspecified: Secondary | ICD-10-CM | POA: Diagnosis not present

## 2022-02-25 DIAGNOSIS — E049 Nontoxic goiter, unspecified: Secondary | ICD-10-CM | POA: Diagnosis not present

## 2022-02-25 DIAGNOSIS — I340 Nonrheumatic mitral (valve) insufficiency: Secondary | ICD-10-CM | POA: Diagnosis not present

## 2022-02-25 DIAGNOSIS — I350 Nonrheumatic aortic (valve) stenosis: Secondary | ICD-10-CM | POA: Diagnosis not present

## 2022-02-25 DIAGNOSIS — Z1331 Encounter for screening for depression: Secondary | ICD-10-CM | POA: Diagnosis not present

## 2022-02-25 DIAGNOSIS — Z79899 Other long term (current) drug therapy: Secondary | ICD-10-CM | POA: Diagnosis not present

## 2022-02-25 DIAGNOSIS — Z1231 Encounter for screening mammogram for malignant neoplasm of breast: Secondary | ICD-10-CM | POA: Diagnosis not present

## 2022-03-08 DIAGNOSIS — I340 Nonrheumatic mitral (valve) insufficiency: Secondary | ICD-10-CM | POA: Diagnosis not present

## 2022-09-02 DIAGNOSIS — R600 Localized edema: Secondary | ICD-10-CM | POA: Diagnosis not present

## 2022-09-02 DIAGNOSIS — R635 Abnormal weight gain: Secondary | ICD-10-CM | POA: Diagnosis not present

## 2022-09-02 DIAGNOSIS — R0602 Shortness of breath: Secondary | ICD-10-CM | POA: Diagnosis not present

## 2022-09-02 DIAGNOSIS — R058 Other specified cough: Secondary | ICD-10-CM | POA: Diagnosis not present

## 2022-09-02 DIAGNOSIS — Z0000 Encounter for general adult medical examination without abnormal findings: Secondary | ICD-10-CM | POA: Diagnosis not present

## 2022-09-02 DIAGNOSIS — I440 Atrioventricular block, first degree: Secondary | ICD-10-CM | POA: Diagnosis not present

## 2022-09-16 DIAGNOSIS — R058 Other specified cough: Secondary | ICD-10-CM | POA: Diagnosis not present

## 2022-09-16 DIAGNOSIS — I5030 Unspecified diastolic (congestive) heart failure: Secondary | ICD-10-CM | POA: Diagnosis not present

## 2022-09-16 DIAGNOSIS — R635 Abnormal weight gain: Secondary | ICD-10-CM | POA: Diagnosis not present

## 2022-09-16 DIAGNOSIS — I440 Atrioventricular block, first degree: Secondary | ICD-10-CM | POA: Diagnosis not present

## 2022-09-16 DIAGNOSIS — R600 Localized edema: Secondary | ICD-10-CM | POA: Diagnosis not present

## 2022-09-16 DIAGNOSIS — R0602 Shortness of breath: Secondary | ICD-10-CM | POA: Diagnosis not present

## 2022-11-10 IMAGING — CR DG FOOT COMPLETE 3+V*L*
3 series · 3 of 3 positions shown · non-contrast
Comparison: None.

CLINICAL DATA: Foot pain and redness

EXAM:
LEFT FOOT - COMPLETE 3+ VIEW

[t foot oblique left]
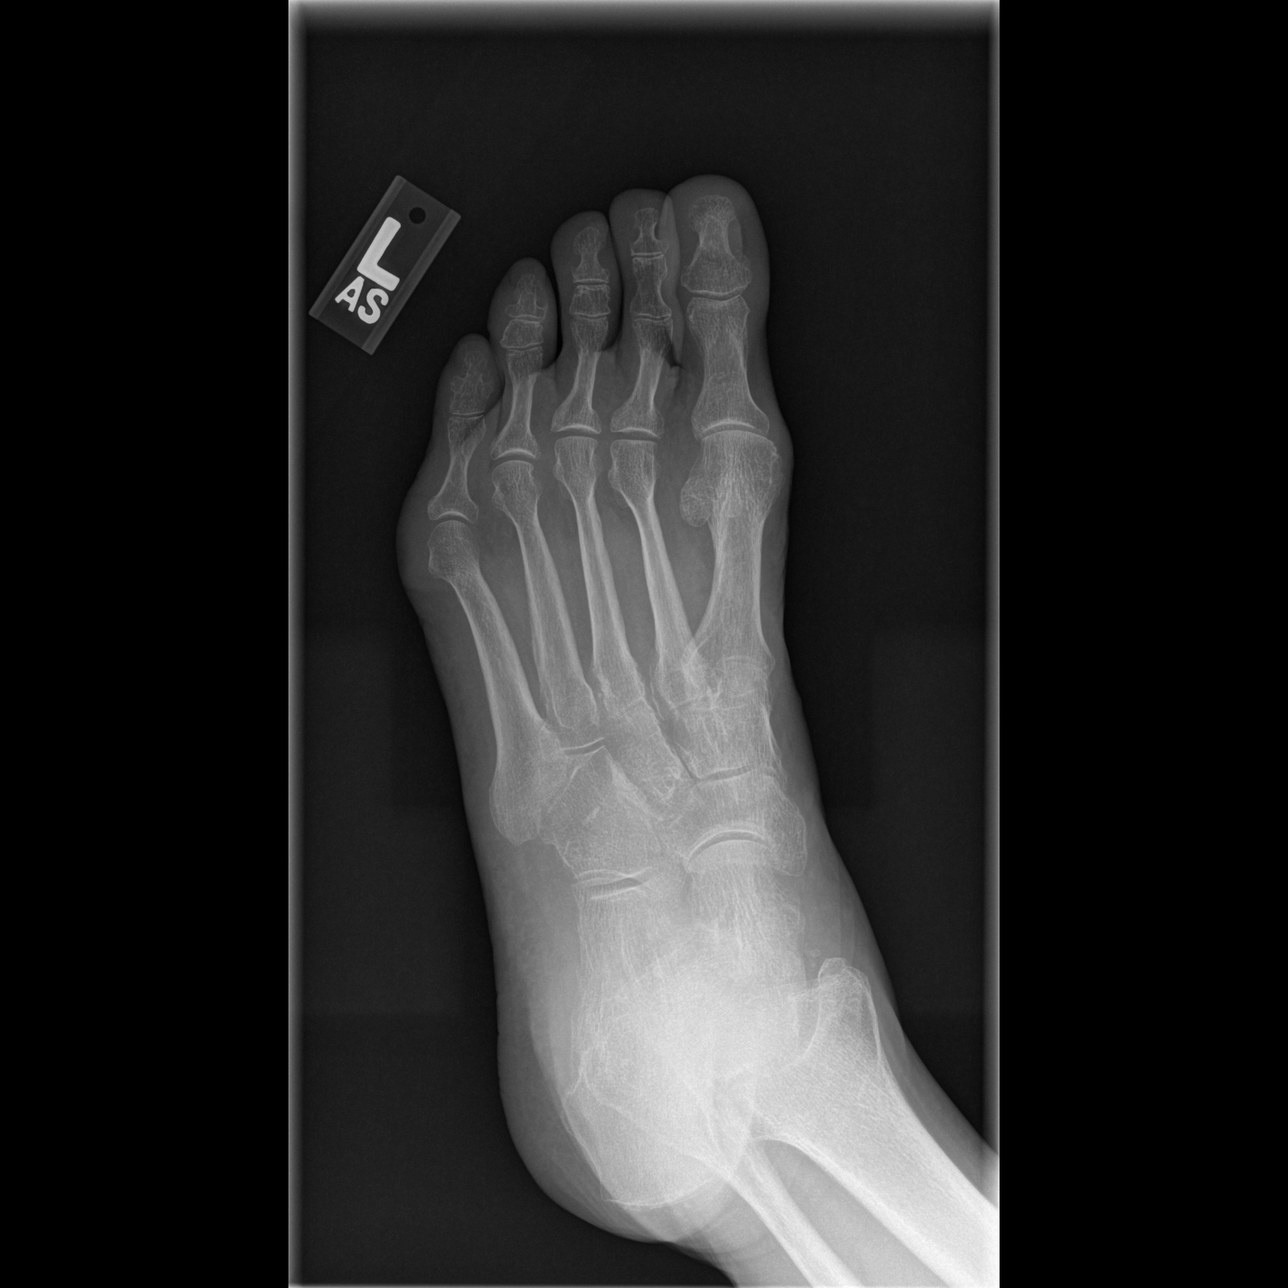

[t foot ap left]
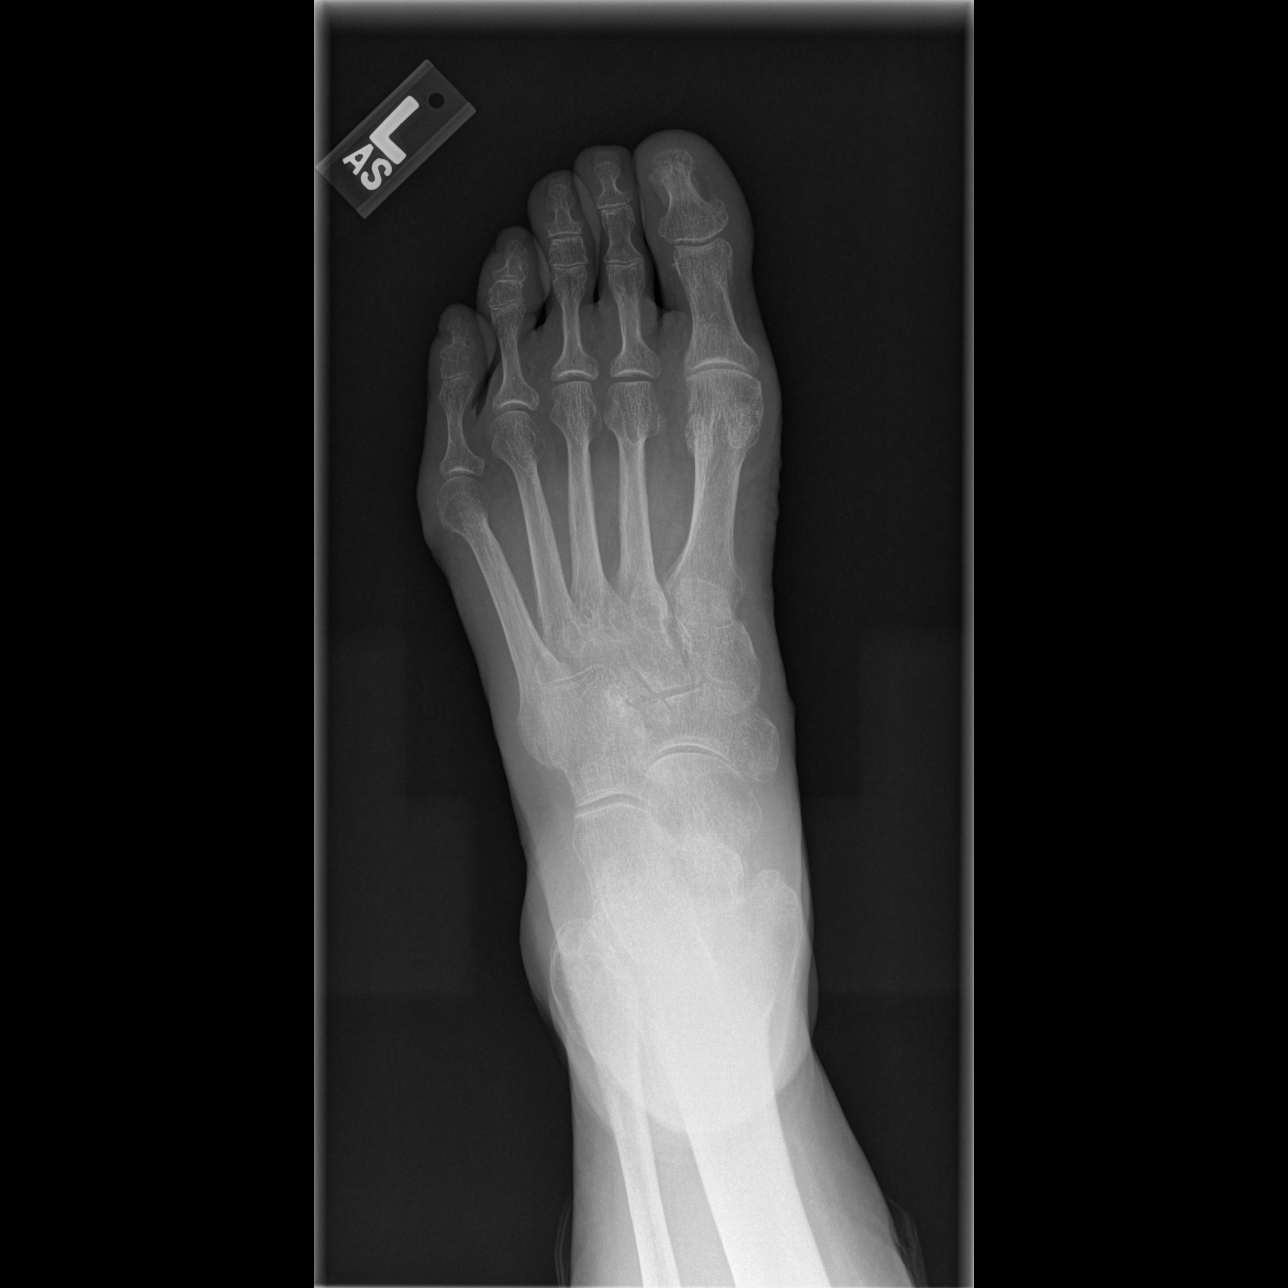

[t foot lat left]
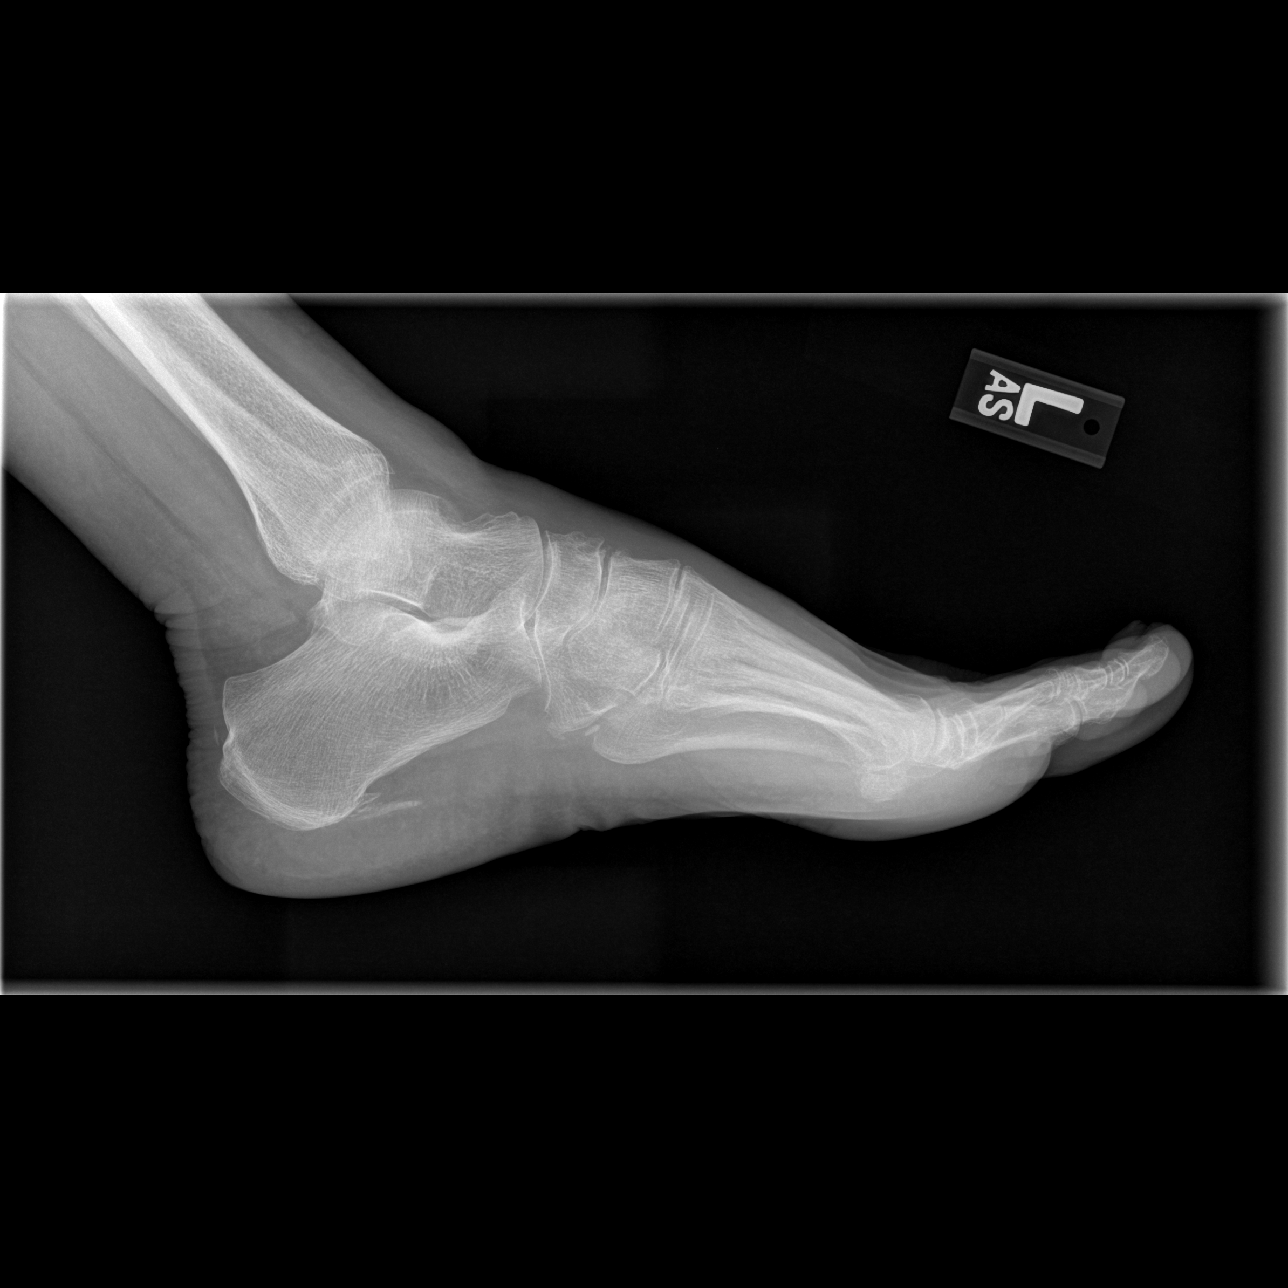

[3 of 3 positions shown; findings below may reference images not displayed]

FINDINGS: No fracture or malalignment. No periostitis or bone destruction.
Plantar calcaneal spur and soft tissue calcification. Possible ulcer
at the level of the fifth MTP joint.
IMPRESSION: No acute osseous abnormality

## 2022-11-27 DIAGNOSIS — R609 Edema, unspecified: Secondary | ICD-10-CM | POA: Diagnosis not present

## 2022-11-27 DIAGNOSIS — I4891 Unspecified atrial fibrillation: Secondary | ICD-10-CM | POA: Diagnosis not present

## 2022-12-06 DIAGNOSIS — I4891 Unspecified atrial fibrillation: Secondary | ICD-10-CM | POA: Diagnosis not present

## 2022-12-13 DIAGNOSIS — I440 Atrioventricular block, first degree: Secondary | ICD-10-CM | POA: Diagnosis not present

## 2022-12-13 DIAGNOSIS — I5030 Unspecified diastolic (congestive) heart failure: Secondary | ICD-10-CM | POA: Diagnosis not present

## 2022-12-13 DIAGNOSIS — I4891 Unspecified atrial fibrillation: Secondary | ICD-10-CM | POA: Diagnosis not present

## 2022-12-13 DIAGNOSIS — I340 Nonrheumatic mitral (valve) insufficiency: Secondary | ICD-10-CM | POA: Diagnosis not present

## 2022-12-13 DIAGNOSIS — R609 Edema, unspecified: Secondary | ICD-10-CM | POA: Diagnosis not present

## 2022-12-17 DIAGNOSIS — I4891 Unspecified atrial fibrillation: Secondary | ICD-10-CM | POA: Diagnosis not present

## 2022-12-20 DIAGNOSIS — I4819 Other persistent atrial fibrillation: Secondary | ICD-10-CM | POA: Diagnosis not present

## 2022-12-30 DIAGNOSIS — N958 Other specified menopausal and perimenopausal disorders: Secondary | ICD-10-CM | POA: Diagnosis not present

## 2022-12-30 DIAGNOSIS — N904 Leukoplakia of vulva: Secondary | ICD-10-CM | POA: Diagnosis not present

## 2023-01-24 ENCOUNTER — Ambulatory Visit: Payer: Medicare PPO | Attending: Cardiology | Admitting: Cardiology

## 2023-01-24 ENCOUNTER — Telehealth: Payer: Self-pay | Admitting: Internal Medicine

## 2023-01-24 ENCOUNTER — Encounter: Payer: Self-pay | Admitting: Cardiology

## 2023-01-24 ENCOUNTER — Encounter: Payer: Self-pay | Admitting: *Deleted

## 2023-01-24 VITALS — BP 116/72 | HR 73 | Ht 66.0 in | Wt 135.0 lb

## 2023-01-24 DIAGNOSIS — I4819 Other persistent atrial fibrillation: Secondary | ICD-10-CM

## 2023-01-24 DIAGNOSIS — Z01812 Encounter for preprocedural laboratory examination: Secondary | ICD-10-CM | POA: Diagnosis not present

## 2023-01-24 DIAGNOSIS — I350 Nonrheumatic aortic (valve) stenosis: Secondary | ICD-10-CM

## 2023-01-24 LAB — BASIC METABOLIC PANEL
BUN/Creatinine Ratio: 21 (ref 12–28)
BUN: 14 mg/dL (ref 8–27)
CO2: 28 mmol/L (ref 20–29)
Calcium: 9.2 mg/dL (ref 8.7–10.3)
Chloride: 105 mmol/L (ref 96–106)
Creatinine, Ser: 0.67 mg/dL (ref 0.57–1.00)
Glucose: 103 mg/dL — ABNORMAL HIGH (ref 70–99)
Potassium: 4.4 mmol/L (ref 3.5–5.2)
Sodium: 143 mmol/L (ref 134–144)
eGFR: 86 mL/min/{1.73_m2} (ref >59)

## 2023-01-24 LAB — CBC
Hematocrit: 41.4 % (ref 34.0–46.6)
Hemoglobin: 13.3 g/dL (ref 11.1–15.9)
MCH: 30.4 pg (ref 26.6–33.0)
MCHC: 32.1 g/dL (ref 31.5–35.7)
MCV: 95 fL (ref 79–97)
Platelets: 175 10*3/uL (ref 150–450)
RBC: 4.37 x10E6/uL (ref 3.77–5.28)
RDW: 12.7 % (ref 11.7–15.4)
WBC: 4.9 10*3/uL (ref 3.4–10.8)

## 2023-01-24 NOTE — Telephone Encounter (Signed)
Pt is requesting cb to discuss upcoming cardioversion on 10/28. She needs to reschedule due to transportation issues.

## 2023-01-24 NOTE — Progress Notes (Signed)
Cardiology Office Note:  .   Date:  01/24/2023  ID:  Lindsay Ware, DOB December 23, 1935, MRN [id number] PCP: Emilio Aspen, MD  [facility] Providers Cardiologist:  None    History of Present Illness: .   Lindsay Ware is a 87 y.o. female Discussed the use of AI scribe software for clinical note transcription with the patient, who gave verbal consent to proceed.  History of Present Illness   The patient, an 87 year old female with a history of atrial fibrillation, PVCs, and mild aortic valve stenosis, was referred by Dr. Orson Aloe for further evaluation.   The patient's atrial fibrillation was detected on a Xio monitor, which showed a 100% atrial fibrillation burden. The patient reported an episode of shortness of breath, ankle swelling, and weight gain while at the beach in May. Following this episode, the patient was started on Lasix.  In August, the patient experienced a severe episode of vomiting and general malaise after working in the yard without adequate hydration, having taken Lasix that morning. EMS was called, and EKGs were performed, which the patient reported as being "crazy." Following this episode, the patient was seen by Dr. Orson Aloe, who initiated monitoring.  The patient has been adhering to a new medication regimen, which includes Eliquis and metoprolol. The patient reported no bleeding issues but did note increased bruising, which she attributed to her medications. The patient denied any history of smoking and was unsure if there was a family history of atrial fibrillation. The patient's mother had aortic valve disease, which was the cause of her death. The patient's father's cardiac history was unknown.          ROS: Occasional bruising forearms  Studies Reviewed: Marland Kitchen   EKG Interpretation Date/Time:  Friday January 24 2023 10:19:13 EDT Ventricular Rate:  79 PR Interval:    QRS Duration:  92 QT Interval:  392 QTC Calculation: 449 R Axis:   67  Text  Interpretation: Atrial fibrillation Abnormal QRS-T angle, consider primary T wave abnormality When compared with ECG of 01-Aug-2003 13:01, Atrial fibrillation has replaced Sinus rhythm Confirmed by Donato Schultz ([id number]) on 01/24/2023 10:22:15 AM    Results LABS Creatinine: 0.66 Potassium: 3.9 ALT: 13 Hemoglobin: 13.6 BNP: 244 TSH: 2.1  DIAGNOSTIC Echocardiogram 2020: Mild aortic valve stenosis with Vmax of 2.7 m/s and dimensionless index of 0.34. Mitral valve showed bi-leaflet prolapse with mild to moderate mitral valve regurgitation. Normal ejection fraction. Xeo monitor: 100% atrial fibrillation burden  Risk Assessment/Calculations:    CHA2DS2-VASc Score = 3   This indicates a 3.2% annual risk of stroke. The patient's score is based upon: CHF History: 0 HTN History: 0 Diabetes History: 0 Stroke History: 0 Vascular Disease History: 0 Age Score: 2 Gender Score: 1            Physical Exam:   VS:  BP 116/72   Pulse 73   Ht 5\' 6"  (1.676 m)   Wt 135 lb (61.2 kg)   SpO2 95%   BMI 21.79 kg/m    Wt Readings from Last 3 Encounters:  01/24/23 135 lb (61.2 kg)  04/21/15 146 lb 3.2 oz (66.3 kg)  08/31/13 146 lb (66.2 kg)    GEN: Well nourished, well developed in no acute distress NECK: No JVD; No carotid bruits CARDIAC: IRRR, 2/6 SM, no rubs, no gallops RESPIRATORY:  Clear to auscultation without rales, wheezing or rhonchi  ABDOMEN: Soft, non-tender, non-distended EXTREMITIES:  No edema; No deformity   ASSESSMENT AND PLAN: .  Assessment and Plan    Atrial Fibrillation persistent 100% burden on recent Xio monitor. Patient is currently on Eliquis 5mg  twice daily (no missed doses since starting 12/26/22) and Metoprolol 12.5 mg twice a day. Discussed the option of cardioversion to restore normal rhythm. -Schedule cardioversion. -Continue Eliquis 5mg  twice daily and Metoprolol.  Valvular Heart Disease Mild aortic valve stenosis and bi-leaflet prolapse with mild to  moderate mitral valve regurgitation on echocardiogram from 2020. No current symptoms reported. -Order repeat transthoracic echocardiogram to assess current valve function.  Follow-up in 2-3 months with APP.           Informed Consent   Shared Decision Making/Informed Consent The risks (stroke, cardiac arrhythmias rarely resulting in the need for a temporary or permanent pacemaker, skin irritation or burns and complications associated with conscious sedation including aspiration, arrhythmia, respiratory failure and death), benefits (restoration of normal sinus rhythm) and alternatives of a direct current cardioversion were explained in detail to Ms. Lindsay Ware and she agrees to proceed.         Signed, Donato Schultz, MD

## 2023-01-24 NOTE — Telephone Encounter (Signed)
Cardioversion rescheduled as requested by pt.  Now scheduled for Tuesday October 29 at 9:30 am with Dr Mayford Knife.  Pt is aware to arrive at 8:30 am.

## 2023-01-24 NOTE — Patient Instructions (Signed)
Medication Instructions:  The current medical regimen is effective;  continue present plan and medications.  *If you need a refill on your cardiac medications before your next appointment, please call your pharmacy*   Lab Work: Please have blood work today (CBC, BMP)  If you have labs (blood work) drawn today and your tests are completely normal, you will receive your results only by: MyChart Message (if you have MyChart) OR A paper copy in the mail If you have any lab test that is abnormal or we need to change your treatment, we will call you to review the results.   Testing/Procedures: Your physician has requested that you have an echocardiogram. Echocardiography is a painless test that uses sound waves to create images of your heart. It provides your doctor with information about the size and shape of your heart and how well your heart's chambers and valves are working. This procedure takes approximately one hour. There are no restrictions for this procedure. Please do NOT wear cologne, perfume, aftershave, or lotions (deodorant is allowed). Please arrive 15 minutes prior to your appointment time.  Your physician has requested that you have a Cardioversion.  Electrical Cardioversion uses a jolt of electricity to your heart either through paddles or wired patches attached to your chest. This is a controlled, usually prescheduled, procedure. This procedure is done at the hospital and you are not awake during the procedure. You usually go home the day of the procedure. Please see the instruction sheet given to you today for more information.  Follow-Up: At [facility], you and your health needs are our priority.  As part of our continuing mission to provide you with exceptional heart care, we have created designated Provider Care Teams.  These Care Teams include your primary Cardiologist (physician) and Advanced Practice Providers (APPs -  Physician Assistants and Nurse  Practitioners) who all work together to provide you with the care you need, when you need it.  We recommend signing up for the patient portal called "MyChart".  Sign up information is provided on this After Visit Summary.  MyChart is used to connect with patients for Virtual Visits (Telemedicine).  Patients are able to view lab/test results, encounter notes, upcoming appointments, etc.  Non-urgent messages can be sent to your provider as well.   To learn more about what you can do with MyChart, go to [email or web address].    Your next appointment:   2 - 3 month(s)  Provider:   Jari Favre, PA-C, Robin Searing, NP, Jacolyn Reedy, PA-C, Eligha Bridegroom, NP, Tereso Newcomer, PA-C, or Perlie Gold, PA-C

## 2023-01-24 NOTE — Telephone Encounter (Signed)
Patient would like to reschedule for 10/29 or 10/31, Will forward to [facility], Dr. Anne Fu' nurse.

## 2023-01-24 NOTE — Addendum Note (Signed)
Addended by: Donato Schultz C on: 01/24/2023 01:03 PM   Modules accepted: Orders

## 2023-01-25 ENCOUNTER — Ambulatory Visit
Admission: EM | Admit: 2023-01-25 | Discharge: 2023-01-25 | Disposition: A | Discharge status: Home / Self Care | Payer: Medicare PPO | Attending: Physician Assistant | Admitting: Physician Assistant

## 2023-01-25 ENCOUNTER — Other Ambulatory Visit: Payer: Self-pay

## 2023-01-25 DIAGNOSIS — H1031 Unspecified acute conjunctivitis, right eye: Secondary | ICD-10-CM | POA: Diagnosis not present

## 2023-01-25 MED ORDER — POLYMYXIN B-TRIMETHOPRIM 10000-0.1 UNIT/ML-% OP SOLN: 1.0000 [drp] | OPHTHALMIC | 0 refills | Status: AC

## 2023-01-25 NOTE — ED Provider Notes (Signed)
[facility]    CSN: [id number] Arrival date & time: 01/25/23  1056      History   Chief Complaint Chief Complaint  Patient presents with   Eye Pain    HPI Lindsay Ware is a 87 y.o. female.   Patient presents today for redness to her right eye that she first noticed yesterday.  She reports some mild pain associated with same and has had drainage and crusting of her eye this morning.  She has not any headaches.  She denies any nausea or vomiting.  She does not report any injury.  She has tried Pataday drops without resolution.  The history is provided by the patient.  Eye Pain Associated symptoms include abdominal pain.    Past Medical History:  Diagnosis Date   Diastolic dysfunction    Goiter    H/O left knee surgery    MCI (mild cognitive impairment)    Memory loss    Memory loss    Migraine with aura and with status migrainosus, not intractable    Mitral regurgitation    Nonrheumatic aortic (valve) stenosis    Persistent atrial fibrillation (HCC)    PMR (polymyalgia rheumatica) (HCC)    Vaginal atrophy    Vitamin B12 deficiency     Patient Active Problem List   Diagnosis Date Noted   Pain due to onychomycosis of toenails of both feet 09/25/2018   Onychia and paronychia of toe 08/31/2013   Onychomycosis 08/31/2013   Pain in lower limb 08/31/2013    Past Surgical History:  Procedure Laterality Date   ABDOMINAL HYSTERECTOMY     BREAST CYST EXCISION Right    BREAST LUMPECTOMY     KNEE SURGERY     LAPAROSCOPIC ENDOMETRIOSIS FULGURATION     LUMBAR DISC SURGERY     RIGHT CATARACT     RIGHT SHOULDER SURGERY      OB History   No obstetric history on file.      Home Medications    Prior to Admission medications   Medication Sig Start Date End Date Taking? Authorizing Provider  trimethoprim-polymyxin b (POLYTRIM) ophthalmic solution Place 1 drop into both eyes every 4 (four) hours for 7 days. 01/25/23 02/01/23 Yes Tomi Bamberger, PA-C   ADVAIR DISKUS 100-50 MCG/DOSE AEPB  07/09/18   [provider]  amoxicillin-clavulanate (AUGMENTIN) 875-125 MG tablet Take 1 tablet by mouth 2 (two) times daily. 04/21/15   Sherren Mocha, MD  cholecalciferol (VITAMIN D) 1000 units tablet Take 1,000 Units by mouth daily.    [provider]  dextromethorphan-guaiFENesin (MUCINEX DM) 30-600 MG 12hr tablet Take 1 tablet by mouth 2 (two) times daily.    [provider]  ELIQUIS 5 MG TABS tablet Take 5 mg by mouth 2 (two) times daily. 01/22/23   [provider]  fluticasone (FLONASE) 50 MCG/ACT nasal spray Place into both nostrils daily.    [provider]  meloxicam (MOBIC) 15 MG tablet  01/30/18   [provider]  metoprolol tartrate (LOPRESSOR) 25 MG tablet Take 12.5 mg by mouth 2 (two) times daily.    [provider]  Misc Natural Products (OSTEO BI-FLEX ADV DOUBLE ST) TABS Take by mouth 2 (two) times daily.    [provider]    Family History Family History  Problem Relation Age of Onset   Heart disease Mother    Cancer Father    Polymyositis Sister    Heart disease Brother     Social  History Social History   Tobacco Use   Smoking status: Former   Smokeless tobacco: Never  Substance Use Topics   Alcohol use: Yes    Alcohol/week: 3.0 standard drinks of alcohol    Types: 3 Glasses of wine per week   Drug use: No     Allergies   Sulfa antibiotics   Review of Systems Review of Systems  Constitutional:  Negative for chills and fever.  Eyes:  Positive for pain, discharge and redness.  Gastrointestinal:  Positive for abdominal pain. Negative for nausea and vomiting.     Physical Exam Triage Vital Signs ED Triage Vitals  Encounter Vitals Group     BP 01/25/23 1124 106/76     Systolic BP Percentile --      Diastolic BP Percentile --      Pulse Rate 01/25/23 1124 76     Resp 01/25/23 1124 16     Temp 01/25/23 1124 98.1 F (36.7 C)     Temp Source  01/25/23 1124 Oral     SpO2 01/25/23 1124 98 %     Weight --      Height --      Head Circumference --      Peak Flow --      Pain Score 01/25/23 1123 0     Pain Loc --      Pain Education --      Exclude from Growth Chart --    No data found.  Updated Vital Signs BP 106/76 (BP Location: Left Arm)   Pulse 76   Temp 98.1 F (36.7 C) (Oral)   Resp 16   SpO2 98%       Physical Exam Vitals and nursing note reviewed.  Constitutional:      General: She is not in acute distress.    Appearance: Normal appearance. She is not ill-appearing.  HENT:     Head: Normocephalic and atraumatic.  Eyes:     Extraocular Movements: Extraocular movements intact.     Pupils: Pupils are equal, round, and reactive to light.     Comments: Right conjunctiva injected, left conjunctiva normal  Cardiovascular:     Rate and Rhythm: Normal rate.  Pulmonary:     Effort: Pulmonary effort is normal. No respiratory distress.  Neurological:     Mental Status: She is alert.  Psychiatric:        Mood and Affect: Mood normal.        Behavior: Behavior normal.        Thought Content: Thought content normal.      UC Treatments / Results  Labs (all labs ordered are listed, but only abnormal results are displayed) Labs Reviewed - No data to display  EKG   Radiology No results found.  Procedures Procedures (including critical care time)  Medications Ordered in UC Medications - No data to display  Initial Impression / Assessment and Plan / UC Course  I have reviewed the triage vital signs and the nursing notes.  Pertinent labs & imaging results that were available during my care of the patient were reviewed by me and considered in my medical decision making (see chart for details).    Presentation consistent with conjunctivitis.  Will treat with antibiotic drops and recommended she not use any other eyedrops while using antibiotics.  Encouraged follow-up if no gradual improvement with any  further concerns.  Final Clinical Impressions(s) / UC Diagnoses   Final diagnoses:  Acute conjunctivitis of right eye, unspecified  acute conjunctivitis type   Discharge Instructions   None    ED Prescriptions     Medication Sig Dispense Auth. Provider   trimethoprim-polymyxin b (POLYTRIM) ophthalmic solution Place 1 drop into both eyes every 4 (four) hours for 7 days. 10 mL Tomi Bamberger, PA-C      PDMP not reviewed this encounter.   Tomi Bamberger, PA-C 01/25/23 1223

## 2023-01-25 NOTE — ED Triage Notes (Signed)
Patient presents to [facility] for eye pain to right eye. Reports eye drainage, redness, and discomfort since yesterday. States she thinks she caught an infection from sharing her husbands Pataday eye drops.

## 2023-01-27 NOTE — Progress Notes (Signed)
Spoke with patient, Procedure scheduled for 0930, Please arrive at the hospital at 0830, NPO after midnight on Monday, May take meds with sips of water in the AM, please have transportation for home post procedure, and someone to stay with pt for approximately 24 hours after  Pt states she has been taking Eliquis regularly with no missed doses

## 2023-01-27 NOTE — Telephone Encounter (Signed)
Pt went to UrgentCare on Saturday and was DX with pink eye.  Was started on antibiotic eye drop and has been using it every 4 hours as ordered.  She is scheduled for outpt cardioversion on Tuesday.  Reviewed this information with Dr Elease Hashimoto who advised pt OK to proceed with cardioversion.  Pt is aware

## 2023-01-27 NOTE — Telephone Encounter (Signed)
Patient has pink eye, calling to see if she needs to keep her appt for tomorrow. Please advise

## 2023-01-28 ENCOUNTER — Encounter (HOSPITAL_COMMUNITY): Payer: Self-pay | Admitting: Cardiology

## 2023-01-28 ENCOUNTER — Ambulatory Visit (HOSPITAL_COMMUNITY): Payer: Medicare PPO | Admitting: Anesthesiology

## 2023-01-28 ENCOUNTER — Other Ambulatory Visit: Payer: Self-pay

## 2023-01-28 ENCOUNTER — Encounter (HOSPITAL_COMMUNITY)
Admission: RE | Disposition: A | Discharge status: Home / Self Care | Payer: Self-pay | Source: Home / Self Care | Attending: Cardiology

## 2023-01-28 ENCOUNTER — Ambulatory Visit (HOSPITAL_COMMUNITY)
Admission: RE | Admit: 2023-01-28 | Discharge: 2023-01-28 | Disposition: A | Discharge status: Home / Self Care | Payer: Medicare PPO | Attending: Cardiology | Admitting: Cardiology

## 2023-01-28 DIAGNOSIS — Z87891 Personal history of nicotine dependence: Secondary | ICD-10-CM | POA: Diagnosis not present

## 2023-01-28 DIAGNOSIS — I350 Nonrheumatic aortic (valve) stenosis: Secondary | ICD-10-CM | POA: Diagnosis not present

## 2023-01-28 DIAGNOSIS — I10 Essential (primary) hypertension: Secondary | ICD-10-CM | POA: Insufficient documentation

## 2023-01-28 DIAGNOSIS — I4891 Unspecified atrial fibrillation: Secondary | ICD-10-CM

## 2023-01-28 DIAGNOSIS — Z7901 Long term (current) use of anticoagulants: Secondary | ICD-10-CM | POA: Diagnosis not present

## 2023-01-28 DIAGNOSIS — Z79899 Other long term (current) drug therapy: Secondary | ICD-10-CM | POA: Diagnosis not present

## 2023-01-28 DIAGNOSIS — I4819 Other persistent atrial fibrillation: Secondary | ICD-10-CM | POA: Diagnosis not present

## 2023-01-28 HISTORY — PX: CARDIOVERSION: SHX1299

## 2023-01-28 SURGERY — CARDIOVERSION
Anesthesia: General

## 2023-01-28 MED ORDER — LIDOCAINE 2% (20 MG/ML) 5 ML SYRINGE
INTRAMUSCULAR | Status: DC | PRN
  Administered 2023-01-28: 20 mg via INTRAVENOUS

## 2023-01-28 MED ORDER — SODIUM CHLORIDE 0.9% FLUSH: 10.0000 mL | Freq: Two times a day (BID) | INTRAVENOUS | Status: DC

## 2023-01-28 MED ORDER — SODIUM CHLORIDE 0.9% FLUSH
INTRAVENOUS | Status: DC | PRN
  Administered 2023-01-28: 10 mL via INTRAVENOUS

## 2023-01-28 MED ORDER — PROPOFOL 10 MG/ML IV BOLUS
INTRAVENOUS | Status: DC | PRN
  Administered 2023-01-28: 50 mg via INTRAVENOUS

## 2023-01-28 SURGICAL SUPPLY — 1 items: PAD DEFIB RADIO PHYSIO CONN (PAD) ×2 IMPLANT

## 2023-01-28 NOTE — CV Procedure (Signed)
   Electrical Cardioversion Procedure Note Lindsay Ware [id number] 1935-09-09  Procedure: Electrical Cardioversion Indications:  Atrial Fibrillation  Time Out: Verified patient identification, verified procedure,medications/allergies/relevent history reviewed, required imaging and test results available.  Performed  Procedure Details  The patient signed informed consent.   The patient was NPO past midnight. Has had therapeutic anticoagulation with Eliquis greater than 3 weeks. The patient denies any interruption of anticoagulation.  Anesthesia was administered by Dr. Sampson Goon.  Adequate airway was maintained throughout and vital followed per protocol.  He was cardioverted x 1 with 200J of biphasic synchronized energy.  He converted to NSR.  There were no apparent complications.  The patient tolerated the procedure well and had normal neuro status and respiratory status post procedure with vitals stable as recorded elsewhere.     IMPRESSION:  Successful cardioversion of atrial fibrillation.   Follow up:  We will arrange follow up with primary cardiologist .  He will continue on current medical therapy.  The patient advised to continue anticoagulation.    Lindsay Ware 01/28/2023, 9:23 AM

## 2023-01-28 NOTE — Transfer of Care (Signed)
Immediate Anesthesia Transfer of Care Note  Patient: Lindsay Ware  Procedure(s) Performed: CARDIOVERSION  Patient Location: Cath Lab  Anesthesia Type:General  Level of Consciousness: awake, alert , oriented, patient cooperative, and responds to stimulation  Airway & Oxygen Therapy: Patient Spontanous Breathing and Patient connected to nasal cannula oxygen  Post-op Assessment: Report given to RN and Post -op Vital signs reviewed and stable  Post vital signs: Reviewed and stable  Last Vitals:  Vitals Value Taken Time  BP 109/67 01/28/23 0915  Temp 97.3F   Pulse 54 01/28/23 0915  Resp 15 01/28/23 0915  SpO2 93 % 01/28/23 0915       Complications: No notable events documented.

## 2023-01-28 NOTE — H&P (Signed)
completed

## 2023-01-28 NOTE — Anesthesia Postprocedure Evaluation (Signed)
Anesthesia Post Note  Patient: Lindsay Ware  Procedure(s) Performed: CARDIOVERSION     Patient location during evaluation: PACU Anesthesia Type: General Level of consciousness: awake and alert Pain management: pain level controlled Vital Signs Assessment: post-procedure vital signs reviewed and stable Respiratory status: spontaneous breathing, nonlabored ventilation, respiratory function stable and patient connected to nasal cannula oxygen Cardiovascular status: blood pressure returned to baseline and stable Postop Assessment: no apparent nausea or vomiting Anesthetic complications: no  No notable events documented.  Last Vitals:  Vitals:   01/28/23 0945 01/28/23 0950  BP:  129/81  Pulse: (!) 46 62  Resp: 19 17  Temp:    SpO2: 94% 95%    Last Pain:  Vitals:   01/28/23 0921  TempSrc: Temporal  PainSc: 0-No pain                 Kennieth Rad

## 2023-01-28 NOTE — Anesthesia Preprocedure Evaluation (Signed)
Anesthesia Evaluation  Patient identified by MRN, date of birth, ID band Patient awake    Reviewed: Allergy & Precautions, NPO status , Patient's Chart, lab work & pertinent test results  Airway Mallampati: II  TM Distance: >3 FB Neck ROM: Full    Dental  (+) Dental Advisory Given   Pulmonary former smoker   breath sounds clear to auscultation       Cardiovascular hypertension, Pt. on home beta blockers + dysrhythmias Atrial Fibrillation + Valvular Problems/Murmurs AS  Rhythm:Irregular Rate:Normal     Neuro/Psych  Headaches    GI/Hepatic negative GI ROS, Neg liver ROS,,,  Endo/Other  negative endocrine ROS    Renal/GU negative Renal ROS     Musculoskeletal   Abdominal   Peds  Hematology negative hematology ROS (+)   Anesthesia Other Findings IMPRESSIONS     1. Left ventricular ejection fraction, by visual estimation, is 60 to  65%. The left ventricle has normal function. There is mildly increased  left ventricular hypertrophy.   2. Left ventricular diastolic Doppler parameters are indeterminate  pattern of LV diastolic filling.   3. Global right ventricle has normal systolic function.The right  ventricular size is normal. No increase in right ventricular wall  thickness.   4. The aortic valve is tricuspid, moderately calcified. Aortic valve  regurgitation was not visualized by color flow Doppler. Mild aortic valve  stenosis. Vmax 2.7 m/s, MG 15, AVA 1.3, DI 0.34   5. The mitral valve is abnormal. Bileaflet prolapse. Mild to moderate  mitral valve regurgitation.   6. The tricuspid valve is normal in structure. Tricuspid valve  regurgitation is mild.   7. The pulmonic valve was not well visualized. Pulmonic valve  regurgitation is trivial by color flow Doppler.   8. Left atrial size was mildly dilated.   9. Right atrial size was mildly dilated.  10. The inferior vena cava is normal in size with greater than  50%  respiratory variability, suggesting right atrial pressure of 3 mmHg.  11. The tricuspid regurgitant velocity is 2.59 m/s, and with an assumed  right atrial pressure of 3 mmHg, the estimated right ventricular systolic  pressure is normal at 29.8 mmHg.     Reproductive/Obstetrics                             Anesthesia Physical Anesthesia Plan  ASA: 3  Anesthesia Plan: General   Post-op Pain Management:    Induction: Intravenous  PONV Risk Score and Plan: 3  Airway Management Planned: Natural Airway, Mask and Nasal Cannula  Additional Equipment:   Intra-op Plan:   Post-operative Plan:   Informed Consent: I have reviewed the patients History and Physical, chart, labs and discussed the procedure including the risks, benefits and alternatives for the proposed anesthesia with the patient or authorized representative who has indicated his/her understanding and acceptance.       Plan Discussed with:   Anesthesia Plan Comments:        Anesthesia Quick Evaluation

## 2023-01-30 DIAGNOSIS — H0102A Squamous blepharitis right eye, upper and lower eyelids: Secondary | ICD-10-CM | POA: Diagnosis not present

## 2023-01-30 DIAGNOSIS — H10023 Other mucopurulent conjunctivitis, bilateral: Secondary | ICD-10-CM | POA: Diagnosis not present

## 2023-01-30 DIAGNOSIS — H0102B Squamous blepharitis left eye, upper and lower eyelids: Secondary | ICD-10-CM | POA: Diagnosis not present

## 2023-02-24 ENCOUNTER — Ambulatory Visit (HOSPITAL_COMMUNITY): Payer: Medicare PPO | Attending: Cardiology

## 2023-02-24 DIAGNOSIS — I350 Nonrheumatic aortic (valve) stenosis: Secondary | ICD-10-CM | POA: Diagnosis not present

## 2023-02-24 LAB — ECHOCARDIOGRAM COMPLETE
AR max vel: 0.87 cm2
AV Area VTI: 0.82 cm2
AV Area mean vel: 0.79 cm2
AV Mean grad: 16.3 mm[Hg]
AV Peak grad: 28.5 mm[Hg]
Ao pk vel: 2.67 m/s
Area-P 1/2: 3.17 cm2
MV M vel: 5.81 m/s
MV Peak grad: 135 mm[Hg]
S' Lateral: 2.8 cm

## 2023-03-03 ENCOUNTER — Encounter: Payer: Self-pay | Admitting: Internal Medicine

## 2023-03-03 DIAGNOSIS — R609 Edema, unspecified: Secondary | ICD-10-CM | POA: Diagnosis not present

## 2023-03-03 DIAGNOSIS — E538 Deficiency of other specified B group vitamins: Secondary | ICD-10-CM | POA: Diagnosis not present

## 2023-03-03 DIAGNOSIS — Z23 Encounter for immunization: Secondary | ICD-10-CM | POA: Diagnosis not present

## 2023-03-03 DIAGNOSIS — Z1231 Encounter for screening mammogram for malignant neoplasm of breast: Secondary | ICD-10-CM

## 2023-03-03 DIAGNOSIS — Z0000 Encounter for general adult medical examination without abnormal findings: Secondary | ICD-10-CM | POA: Diagnosis not present

## 2023-03-03 DIAGNOSIS — I340 Nonrheumatic mitral (valve) insufficiency: Secondary | ICD-10-CM | POA: Diagnosis not present

## 2023-03-03 DIAGNOSIS — I350 Nonrheumatic aortic (valve) stenosis: Secondary | ICD-10-CM | POA: Diagnosis not present

## 2023-03-03 DIAGNOSIS — E049 Nontoxic goiter, unspecified: Secondary | ICD-10-CM | POA: Diagnosis not present

## 2023-03-03 DIAGNOSIS — I4819 Other persistent atrial fibrillation: Secondary | ICD-10-CM | POA: Diagnosis not present

## 2023-03-03 DIAGNOSIS — Z79899 Other long term (current) drug therapy: Secondary | ICD-10-CM | POA: Diagnosis not present

## 2023-03-04 ENCOUNTER — Ambulatory Visit: Payer: Medicare PPO | Admitting: Cardiology

## 2023-03-04 ENCOUNTER — Other Ambulatory Visit: Payer: Self-pay | Admitting: *Deleted

## 2023-03-04 ENCOUNTER — Other Ambulatory Visit: Payer: Self-pay | Admitting: Internal Medicine

## 2023-03-04 DIAGNOSIS — I350 Nonrheumatic aortic (valve) stenosis: Secondary | ICD-10-CM

## 2023-03-04 DIAGNOSIS — Z1231 Encounter for screening mammogram for malignant neoplasm of breast: Secondary | ICD-10-CM

## 2023-03-05 DIAGNOSIS — M1712 Unilateral primary osteoarthritis, left knee: Secondary | ICD-10-CM | POA: Diagnosis not present

## 2023-03-07 ENCOUNTER — Other Ambulatory Visit (HOSPITAL_COMMUNITY): Payer: Self-pay | Admitting: Medical

## 2023-03-07 ENCOUNTER — Ambulatory Visit (HOSPITAL_COMMUNITY)
Admission: RE | Admit: 2023-03-07 | Discharge: 2023-03-07 | Disposition: A | Discharge status: Home / Self Care | Payer: Medicare PPO | Source: Ambulatory Visit | Attending: Cardiology | Admitting: Cardiology

## 2023-03-07 DIAGNOSIS — M25561 Pain in right knee: Secondary | ICD-10-CM | POA: Insufficient documentation

## 2023-03-07 DIAGNOSIS — M79604 Pain in right leg: Secondary | ICD-10-CM | POA: Diagnosis not present

## 2023-03-15 DIAGNOSIS — M1712 Unilateral primary osteoarthritis, left knee: Secondary | ICD-10-CM | POA: Insufficient documentation

## 2023-04-07 DIAGNOSIS — G8929 Other chronic pain: Secondary | ICD-10-CM | POA: Diagnosis not present

## 2023-04-07 DIAGNOSIS — M25561 Pain in right knee: Secondary | ICD-10-CM | POA: Diagnosis not present

## 2023-04-07 DIAGNOSIS — M25562 Pain in left knee: Secondary | ICD-10-CM | POA: Diagnosis not present

## 2023-04-07 DIAGNOSIS — I4819 Other persistent atrial fibrillation: Secondary | ICD-10-CM | POA: Diagnosis not present

## 2023-04-07 NOTE — Progress Notes (Signed)
 Cardiology Office Note    Patient Name: Lindsay Ware Date of Encounter: 04/07/2023  Primary Care Provider:  Charlott Dorn LABOR, MD Primary Cardiologist:  None Primary Electrophysiologist: None   Past Medical History    Past Medical History:  Diagnosis Date   Diastolic dysfunction    Goiter    H/O left knee surgery    MCI (mild cognitive impairment)    Memory loss    Memory loss    Migraine with aura and with status migrainosus, not intractable    Mitral regurgitation    Nonrheumatic aortic (valve) stenosis    Persistent atrial fibrillation (HCC)    PMR (polymyalgia rheumatica) (HCC)    Vaginal atrophy    Vitamin B12 deficiency     History of Present Illness  Lindsay Ware is a 88 y.o. female with a PMH of persistent AF, PVCs, nonrheumatic AV stenosis polymyalgia rheumatica presents today for 67-month follow-up.  Lindsay Ware was seen initially by Dr. Jeffrie on 01/24/2023 per PCP for complaint of lower extremity swelling and shortness of breath.  She was found to have new onset AF and was started on Eliquis and metoprolol.  She wore a Zio patch that showed 100% burden of AF. She reports experiencing episodes of shortness of breath and ankle swelling with weight gain.  She was started on Lasix by her PCP and in August experienced episodes of vomiting and malaise after taking Lasix was seen in the ED via EMS.  During her visit patient was in AF and was scheduled for DCCV echo for atrial fibrillation and to evaluate AV stenosis.  She underwent procedures on 01/28/2023 with restoration of sinus rhythm.  2D echo was completed showing MVP with mild to moderate regurg and moderate aortic stenosis recommendation to repeat in 1 year due to aortic stent.  Lindsay Ware presents today for 16-month follow-up.  During today's visit she reports Tbeing in sinus rhythm since November, as monitored by a cardia mobile device they are currently on Eliquis and metoprolol for AFib management and report no side  effects from these medications. The patient is active, with no reported swelling or shortness of breath. They recall a previous episode of dehydration and vomiting, which they believe triggered their AFib.  patient denies chest pain, palpitations, dyspnea, PND, orthopnea, nausea, vomiting, dizziness, syncope, edema, weight gain, or early satiety.   Review of Systems  Please see the history of present illness.    All other systems reviewed and are otherwise negative except as noted above.  Physical Exam    Wt Readings from Last 3 Encounters:  01/28/23 134 lb (60.8 kg)  01/24/23 135 lb (61.2 kg)  04/21/15 146 lb 3.2 oz (66.3 kg)   CD:Uyzmz were no vitals filed for this visit.,There is no height or weight on file to calculate BMI. GEN: Well nourished, well developed in no acute distress Neck: No JVD; No carotid bruits Pulmonary: Clear to auscultation without rales, wheezing or rhonchi  Cardiovascular: Normal rate. Regular rhythm. Normal S1. Normal S2.   Murmurs: There is no murmur.  ABDOMEN: Soft, non-tender, non-distended EXTREMITIES:  No edema; No deformity   EKG/LABS/ Recent Cardiac Studies   ECG personally reviewed by me today -none completed today  Risk Assessment/Calculations:    CHA2DS2-VASc Score = 3   This indicates a 3.2% annual risk of stroke. The patient's score is based upon: CHF History: 0 HTN History: 0 Diabetes History: 0 Stroke History: 0 Vascular Disease History: 0 Age Score: 2 Gender Score:  1         Lab Results  Component Value Date   WBC 4.9 01/24/2023   HGB 13.3 01/24/2023   HCT 41.4 01/24/2023   MCV 95 01/24/2023   PLT 175 01/24/2023   Lab Results  Component Value Date   CREATININE 0.67 01/24/2023   BUN 14 01/24/2023   NA 143 01/24/2023   K 4.4 01/24/2023   CL 105 01/24/2023   CO2 28 01/24/2023   No results found for: CHOL, HDL, LDLCALC, LDLDIRECT, TRIG, CHOLHDL  No results found for: HGBA1C Assessment & Plan    1.   Persistent AF: -Patient recently underwent DCCV with restoration to sinus rhythm -Today patient reports maintaining sinus rhythm by Kardia device over the past 4 months. -Patient's last hemoglobin was 13.3 and creatinine was 0.67 -Continue metoprolol and Eliquis 5 mg twice daily -She will wear Zio patch for 14 days to evaluate AF burden  2.  Nonrheumatic aortic stenosis: Mild to moderate regurgitation with thickening of the valve leaflets. No current symptoms of dizziness, chest pain, or shortness of breath. -Continue Metoprolol. -Repeat echocardiogram in November 2025.  3.  Mitral regurgitation: -2D echo completed 01/2023 showing MVP with mild to moderate regurg and moderate aortic stenosis. -Patient will have repeat echo in 1 year to monitor aortic stenosis  4.  History of PVCs: -Currently controlled with current regimen of metoprolol 25 mg     Disposition: Follow-up with None or APP in 6 months    Signed, Lindsay Ware, Lindsay Shove, NP 04/07/2023, 9:03 AM [facility]

## 2023-04-08 ENCOUNTER — Encounter: Payer: Self-pay | Admitting: Nurse Practitioner

## 2023-04-08 ENCOUNTER — Ambulatory Visit: Payer: Medicare PPO | Attending: Nurse Practitioner | Admitting: Nurse Practitioner

## 2023-04-08 ENCOUNTER — Ambulatory Visit (INDEPENDENT_AMBULATORY_CARE_PROVIDER_SITE_OTHER): Payer: Medicare PPO

## 2023-04-08 VITALS — BP 122/70 | HR 56 | Ht 66.0 in | Wt 132.0 lb

## 2023-04-08 DIAGNOSIS — I350 Nonrheumatic aortic (valve) stenosis: Secondary | ICD-10-CM | POA: Diagnosis not present

## 2023-04-08 DIAGNOSIS — I493 Ventricular premature depolarization: Secondary | ICD-10-CM

## 2023-04-08 DIAGNOSIS — I4819 Other persistent atrial fibrillation: Secondary | ICD-10-CM

## 2023-04-08 DIAGNOSIS — I340 Nonrheumatic mitral (valve) insufficiency: Secondary | ICD-10-CM | POA: Diagnosis not present

## 2023-04-08 NOTE — Patient Instructions (Signed)
 Medication Instructions:   Your physician recommends that you continue on your current medications as directed. Please refer to the Current Medication list given to you today.   *If you need a refill on your cardiac medications before your next appointment, please call your pharmacy*   Lab Work:  None ordered.  If you have labs (blood work) drawn today and your tests are completely normal, you will receive your results only by: MyChart Message (if you have MyChart) OR A paper copy in the mail If you have any lab test that is abnormal or we need to change your treatment, we will call you to review the results.   Testing/Procedures:  None    Follow-Up: At [facility], you and your health needs are our priority.  As part of our continuing mission to provide you with exceptional heart care, we have created designated Provider Care Teams.  These Care Teams include your primary Cardiologist (physician) and Advanced Practice Providers (APPs -  Physician Assistants and Nurse Practitioners) who all work together to provide you with the care you need, when you need it.  We recommend signing up for the patient portal called MyChart.  Sign up information is provided on this After Visit Summary.  MyChart is used to connect with patients for Virtual Visits (Telemedicine).  Patients are able to view lab/test results, encounter notes, upcoming appointments, etc.  Non-urgent messages can be sent to your provider as well.   To learn more about what you can do with MyChart, go to [email or web address].    Your next appointment:   5 month(s)  Provider:   Oneil Parchment, MD    Other Instructions  Adopting a Healthy Lifestyle.   Weight: Know what a healthy weight is for you (roughly BMI <25) and aim to maintain this. You can calculate your body mass index on your smart phone. Unfortunately, this is not the most accurate measure of healthy weight, but it is the simplest measurement to use.  A more accurate measurement involves body scanning which measures lean muscle, fat tissue and bony density. We do not have this equipment at [facility].    Diet: Aim for 7+ servings of fruits and vegetables daily Limit animal fats in diet for cholesterol and heart health - choose grass fed whenever available Avoid highly processed foods (fast food burgers, tacos, fried chicken, pizza, hot dogs, french fries)  Saturated fat comes in the form of butter, lard, coconut oil, margarine, partially hydrogenated oils, and fat in meat. These increase your risk of cardiovascular disease.  Use healthy plant oils, such as olive, canola, soy, corn, sunflower and peanut.  Whole foods such as fruits, vegetables and whole grains have fiber  Men need > 38 grams of fiber per day Women need > 25 grams of fiber per day  Load up on vegetables and fruits - one-half of your plate: Aim for color and variety, and remember that potatoes dont count. Go for whole grains - one-quarter of your plate: Whole wheat, barley, wheat berries, quinoa, oats, brown rice, and foods made with them. If you want pasta, go with whole wheat pasta. Protein power - one-quarter of your plate: Fish, chicken, beans, and nuts are all healthy, versatile protein sources. Limit red meat. You need carbohydrates for energy! The type of carbohydrate is more important than the amount. Choose carbohydrates such as vegetables, fruits, whole grains, beans, and nuts in the place of white rice, white pasta, potatoes (baked or fried), macaroni and cheese, cakes, cookies,  and donuts.  If youre thirsty, drink water. Coffee and tea are good in moderation, but skip sugary drinks and limit milk and dairy products to one or two daily servings. Keep sugar intake at 6 teaspoons or 24 grams or LESS       Exercise: Aim for 150 min of moderate intensity exercise weekly for heart health, and weights twice weekly for bone health Stay active - any steps are better than no  steps! Aim for 7-9 hours of sleep daily

## 2023-04-08 NOTE — Progress Notes (Unsigned)
Applied a 14 day Zio XT monitor to patient in the office  Skains to read

## 2023-04-14 ENCOUNTER — Ambulatory Visit: Payer: Medicare PPO

## 2023-04-30 DIAGNOSIS — I4819 Other persistent atrial fibrillation: Secondary | ICD-10-CM | POA: Diagnosis not present

## 2023-04-30 DIAGNOSIS — I350 Nonrheumatic aortic (valve) stenosis: Secondary | ICD-10-CM | POA: Diagnosis not present

## 2023-05-01 ENCOUNTER — Ambulatory Visit: Payer: Medicare PPO

## 2023-05-05 ENCOUNTER — Ambulatory Visit
Admission: RE | Admit: 2023-05-05 | Discharge: 2023-05-05 | Disposition: A | Discharge status: Home / Self Care | Payer: Medicare PPO | Source: Ambulatory Visit | Attending: Internal Medicine | Admitting: Internal Medicine

## 2023-05-05 DIAGNOSIS — Z1231 Encounter for screening mammogram for malignant neoplasm of breast: Secondary | ICD-10-CM

## 2023-05-14 DIAGNOSIS — J069 Acute upper respiratory infection, unspecified: Secondary | ICD-10-CM | POA: Diagnosis not present

## 2023-05-14 DIAGNOSIS — Z03818 Encounter for observation for suspected exposure to other biological agents ruled out: Secondary | ICD-10-CM | POA: Diagnosis not present

## 2023-05-20 DIAGNOSIS — H0102B Squamous blepharitis left eye, upper and lower eyelids: Secondary | ICD-10-CM | POA: Diagnosis not present

## 2023-05-20 DIAGNOSIS — H04123 Dry eye syndrome of bilateral lacrimal glands: Secondary | ICD-10-CM | POA: Diagnosis not present

## 2023-05-20 DIAGNOSIS — H0102A Squamous blepharitis right eye, upper and lower eyelids: Secondary | ICD-10-CM | POA: Diagnosis not present

## 2023-05-20 DIAGNOSIS — R519 Headache, unspecified: Secondary | ICD-10-CM | POA: Diagnosis not present

## 2023-05-20 DIAGNOSIS — H10413 Chronic giant papillary conjunctivitis, bilateral: Secondary | ICD-10-CM | POA: Diagnosis not present

## 2023-05-20 DIAGNOSIS — H4322 Crystalline deposits in vitreous body, left eye: Secondary | ICD-10-CM | POA: Diagnosis not present

## 2023-05-20 DIAGNOSIS — D492 Neoplasm of unspecified behavior of bone, soft tissue, and skin: Secondary | ICD-10-CM | POA: Diagnosis not present

## 2023-05-29 DIAGNOSIS — Z85828 Personal history of other malignant neoplasm of skin: Secondary | ICD-10-CM | POA: Diagnosis not present

## 2023-05-29 DIAGNOSIS — L821 Other seborrheic keratosis: Secondary | ICD-10-CM | POA: Diagnosis not present

## 2023-05-29 DIAGNOSIS — L812 Freckles: Secondary | ICD-10-CM | POA: Diagnosis not present

## 2023-05-29 DIAGNOSIS — L570 Actinic keratosis: Secondary | ICD-10-CM | POA: Diagnosis not present

## 2023-05-29 DIAGNOSIS — D1801 Hemangioma of skin and subcutaneous tissue: Secondary | ICD-10-CM | POA: Diagnosis not present

## 2023-06-18 DIAGNOSIS — M170 Bilateral primary osteoarthritis of knee: Secondary | ICD-10-CM | POA: Diagnosis not present

## 2023-07-07 DIAGNOSIS — M25561 Pain in right knee: Secondary | ICD-10-CM | POA: Diagnosis not present

## 2023-07-07 DIAGNOSIS — M25562 Pain in left knee: Secondary | ICD-10-CM | POA: Diagnosis not present

## 2023-07-14 DIAGNOSIS — M25561 Pain in right knee: Secondary | ICD-10-CM | POA: Diagnosis not present

## 2023-07-14 DIAGNOSIS — M25562 Pain in left knee: Secondary | ICD-10-CM | POA: Diagnosis not present

## 2023-07-17 DIAGNOSIS — M25561 Pain in right knee: Secondary | ICD-10-CM | POA: Diagnosis not present

## 2023-07-17 DIAGNOSIS — M25562 Pain in left knee: Secondary | ICD-10-CM | POA: Diagnosis not present

## 2023-07-21 DIAGNOSIS — M25561 Pain in right knee: Secondary | ICD-10-CM | POA: Diagnosis not present

## 2023-07-21 DIAGNOSIS — M25562 Pain in left knee: Secondary | ICD-10-CM | POA: Diagnosis not present

## 2023-07-28 DIAGNOSIS — M25561 Pain in right knee: Secondary | ICD-10-CM | POA: Diagnosis not present

## 2023-07-28 DIAGNOSIS — M25562 Pain in left knee: Secondary | ICD-10-CM | POA: Diagnosis not present

## 2023-08-11 DIAGNOSIS — R030 Elevated blood-pressure reading, without diagnosis of hypertension: Secondary | ICD-10-CM | POA: Diagnosis not present

## 2023-08-11 DIAGNOSIS — R0981 Nasal congestion: Secondary | ICD-10-CM | POA: Diagnosis not present

## 2023-08-11 DIAGNOSIS — I350 Nonrheumatic aortic (valve) stenosis: Secondary | ICD-10-CM | POA: Diagnosis not present

## 2023-08-19 DIAGNOSIS — R519 Headache, unspecified: Secondary | ICD-10-CM | POA: Diagnosis not present

## 2023-08-26 ENCOUNTER — Other Ambulatory Visit: Payer: Self-pay | Admitting: Internal Medicine

## 2023-08-26 DIAGNOSIS — R030 Elevated blood-pressure reading, without diagnosis of hypertension: Secondary | ICD-10-CM | POA: Diagnosis not present

## 2023-08-26 DIAGNOSIS — R413 Other amnesia: Secondary | ICD-10-CM | POA: Diagnosis not present

## 2023-08-26 DIAGNOSIS — R519 Headache, unspecified: Secondary | ICD-10-CM

## 2023-08-26 DIAGNOSIS — R0981 Nasal congestion: Secondary | ICD-10-CM | POA: Diagnosis not present

## 2023-08-26 DIAGNOSIS — I350 Nonrheumatic aortic (valve) stenosis: Secondary | ICD-10-CM | POA: Diagnosis not present

## 2023-09-04 ENCOUNTER — Ambulatory Visit
Admission: RE | Admit: 2023-09-04 | Discharge: 2023-09-04 | Disposition: A | Discharge status: Home / Self Care | Source: Ambulatory Visit | Attending: Internal Medicine | Admitting: Internal Medicine

## 2023-09-04 DIAGNOSIS — R519 Headache, unspecified: Secondary | ICD-10-CM | POA: Diagnosis not present

## 2023-09-04 MED ORDER — GADOPICLENOL 0.5 MMOL/ML IV SOLN
7.5000 mL | Freq: Once | INTRAVENOUS | Status: AC | PRN
Start: 2023-09-04 — End: 2023-09-04
  Administered 2023-09-04: 7.5 mL via INTRAVENOUS

## 2023-09-18 ENCOUNTER — Ambulatory Visit: Payer: Medicare PPO | Attending: Cardiology | Admitting: Cardiology

## 2023-09-18 ENCOUNTER — Encounter: Payer: Self-pay | Admitting: Cardiology

## 2023-09-18 VITALS — BP 126/64 | HR 61 | Ht 66.0 in | Wt 134.6 lb

## 2023-09-18 DIAGNOSIS — I4819 Other persistent atrial fibrillation: Secondary | ICD-10-CM

## 2023-09-18 DIAGNOSIS — I350 Nonrheumatic aortic (valve) stenosis: Secondary | ICD-10-CM | POA: Diagnosis not present

## 2023-09-18 DIAGNOSIS — I340 Nonrheumatic mitral (valve) insufficiency: Secondary | ICD-10-CM

## 2023-09-18 NOTE — Progress Notes (Signed)
 Cardiology Office Note:  .   Date:  09/18/2023  ID:  Lindsay Ware, DOB 18-Dec-1935, MRN [id number] PCP: Benedetta Bradley, MD  [facility] Providers Cardiologist:  Dorothye Gathers, MD     History of Present Illness: .   Lindsay Ware is a 88 y.o. female Discussed the use of AI scribe software for clinical note transcription with the patient, who gave verbal consent to proceed.  History of Present Illness Lindsay Ware is an 88 year old female with persistent atrial fibrillation and aortic valve stenosis who presents for follow-up.  She is currently in normal sinus rhythm with occasional premature ventricular contractions (PVCs). She has a history of dehydration and vomiting triggering atrial fibrillation in the past and underwent a cardioversion in October 2024. Her CHADS-VASc score is three. She continues to take Eliquis 5 mg twice a day and metoprolol, which helps manage her PVCs.  She has a history of aortic valve stenosis, which was noted to be moderate in severity on an echocardiogram performed in November 2024. The echocardiogram also showed mitral valve prolapse with moderate regurgitation. She reports a long-standing heart murmur. Her family history includes her mother having aortic valve issues, which were not surgically treated, and her brother undergoing surgery for similar issues.  She mentions experiencing headaches and had an MRI two weeks ago, though she has not yet received the results. She describes a recent fall while gardening, where she lost her balance and fell backwards, possibly hitting her head, but reports no nausea or other concerning symptoms following the incident.  Her current medications include Eliquis 5 mg twice daily and a low dose of metoprolol, which she cuts in half. She also takes vitamin D and vitamin C supplements. She is satisfied with her current regimen and reports no issues with bleeding or frequent falls.     Studies Reviewed: Aaron Aas   EKG  Interpretation Date/Time:  Thursday September 18 2023 09:21:34 EDT Ventricular Rate:  61 PR Interval:  216 QRS Duration:  90 QT Interval:  410 QTC Calculation: 412 R Axis:   2  Text Interpretation: Sinus rhythm with 1st degree A-V block with occasional Premature ventricular complexes When compared with ECG of 28-Jan-2023 09:13, Premature ventricular complexes are now Present Confirmed by Dorothye Gathers ([id number]) on 09/18/2023 9:44:44 AM    Results LABS Hb: 13.8 g/dL Cr: 0.7 mg/dL ALT: 15 U/L  DIAGNOSTIC Echocardiogram: Mitral valve prolapse with moderate regurgitation and moderate aortic stenosis (01/2023) EKG: Normal Risk Assessment/Calculations:            Physical Exam:   VS:  BP 126/64 (BP Location: Left Arm, Patient Position: Sitting, Cuff Size: Normal)   Pulse 61   Ht 5' 6 (1.676 m)   Wt 134 lb 9.6 oz (61.1 kg)   SpO2 98%   BMI 21.73 kg/m    Wt Readings from Last 3 Encounters:  09/18/23 134 lb 9.6 oz (61.1 kg)  04/08/23 132 lb (59.9 kg)  01/28/23 134 lb (60.8 kg)    GEN: Well nourished, well developed in no acute distress NECK: No JVD; No carotid bruits CARDIAC: Rare ectopy RRR, 2/6 SM, no rubs, no gallops RESPIRATORY:  Clear to auscultation without rales, wheezing or rhonchi  ABDOMEN: Soft, non-tender, non-distended EXTREMITIES:  No edema; No deformity   ASSESSMENT AND PLAN: .    Assessment and Plan Assessment & Plan Atrial Fibrillation Persistent atrial fibrillation currently in normal sinus rhythm with PVCs. Dehydration and vomiting have triggered AFib episodes. CHADS-VASc  score is 3. She continues on Eliquis 5 mg bid and Metoprolol 12.5 mg bid, which helps with PVCs. Discussed potential AFib recurrence and the importance of continued anticoagulation with Eliquis to prevent stroke, even in normal sinus rhythm. Discussed the Watchman device as an alternative to Eliquis if bleeding issues arise, but currently not necessary as there are no bleeding issues or frequent  falls. - Continue Eliquis 5 mg bid - Continue Metoprolol 12.5 mg bid - Consider Watchman device if bleeding issues arise  Aortic Valve Stenosis Moderate aortic valve stenosis with a 2/6 systolic murmur. Echocardiogram in November 2024 showed moderate stenosis. Discussed potential progression to severe stenosis and the option of transcatheter aortic valve replacement (TAVR) if it becomes severe. Current management involves periodic monitoring with echocardiograms. - Order echocardiogram in one year to monitor aortic valve stenosis  Mitral Valve Prolapse with Regurgitation Mitral valve prolapse with moderate regurgitation noted on echocardiogram in November 2024.  Polymyalgia Rheumatica Polymyalgia rheumatica.Stable  Goals of Care Discussed her family history of aortic valve issues and her mother's decision not to undergo surgery. She is aware of the potential need for future interventions if her condition progresses to severe stenosis. She is informed about the TAVR procedure as a less invasive option compared to traditional surgery.         Dispo: 1 yr  Signed, Dorothye Gathers, MD

## 2023-09-18 NOTE — Patient Instructions (Signed)
 Medication Instructions:  The current medical regimen is effective;  continue present plan and medications.  *If you need a refill on your cardiac medications before your next appointment, please call your pharmacy*  Testing/Procedures: ECHO in one year  Follow-Up: At [facility], you and your health needs are our priority.  As part of our continuing mission to provide you with exceptional heart care, our providers are all part of one team.  This team includes your primary Cardiologist (physician) and Advanced Practice Providers or APPs (Physician Assistants and Nurse Practitioners) who all work together to provide you with the care you need, when you need it.  Your next appointment:   1 year(s) after echocardiogram  Provider:   Dorothye Gathers, MD    We recommend signing up for the patient portal called MyChart.  Sign up information is provided on this After Visit Summary.  MyChart is used to connect with patients for Virtual Visits (Telemedicine).  Patients are able to view lab/test results, encounter notes, upcoming appointments, etc.  Non-urgent messages can be sent to your provider as well.   To learn more about what you can do with MyChart, go to [email or web address].

## 2023-10-06 DIAGNOSIS — M25562 Pain in left knee: Secondary | ICD-10-CM | POA: Insufficient documentation

## 2023-10-10 DIAGNOSIS — M1712 Unilateral primary osteoarthritis, left knee: Secondary | ICD-10-CM | POA: Diagnosis not present

## 2023-10-23 ENCOUNTER — Telehealth: Payer: Self-pay | Admitting: *Deleted

## 2023-10-23 NOTE — Telephone Encounter (Signed)
   Pre-operative Risk Assessment    Patient Name: Lindsay Ware  DOB: Jul 21, 1935 MRN: [id number]   Date of last office visit: 09/18/23 DR. SKAINS Date of next office visit: NONE   Request for Surgical Clearance    Procedure:  LEFT TOTAL KNEE ARTHROPLASTY  Date of Surgery:  Clearance 01/27/24                                Surgeon:  DR. MATTHEW OLIN Surgeon's Group or Practice Name:  [facility] Phone number:  [phone] JOEN SIC Fax number:  [phone]   Type of Clearance Requested:   - Medical  - Pharmacy:  Hold Apixaban (Eliquis)     Type of Anesthesia:  Spinal   Additional requests/questions:    Bonney Niels Jest   10/23/2023, 3:55 PM

## 2023-10-24 NOTE — Telephone Encounter (Signed)
 Pharmacy please advise on holding Eliquis prior to  LEFT TOTAL KNEE ARTHROPLASTY  scheduled for 01/27/2024. Thank you.

## 2023-10-27 DIAGNOSIS — R519 Headache, unspecified: Secondary | ICD-10-CM | POA: Diagnosis not present

## 2023-10-27 DIAGNOSIS — R0981 Nasal congestion: Secondary | ICD-10-CM | POA: Diagnosis not present

## 2023-10-27 DIAGNOSIS — R413 Other amnesia: Secondary | ICD-10-CM | POA: Diagnosis not present

## 2023-10-27 DIAGNOSIS — M25562 Pain in left knee: Secondary | ICD-10-CM | POA: Diagnosis not present

## 2023-10-27 DIAGNOSIS — R030 Elevated blood-pressure reading, without diagnosis of hypertension: Secondary | ICD-10-CM | POA: Diagnosis not present

## 2023-10-27 DIAGNOSIS — G8929 Other chronic pain: Secondary | ICD-10-CM | POA: Diagnosis not present

## 2023-10-27 DIAGNOSIS — I350 Nonrheumatic aortic (valve) stenosis: Secondary | ICD-10-CM | POA: Diagnosis not present

## 2023-10-28 ENCOUNTER — Telehealth: Payer: Self-pay

## 2023-10-28 NOTE — Telephone Encounter (Signed)
 Patient with diagnosis of A Fib on Eliquis for anticoagulation.    Procedure:  LEFT TOTAL KNEE ARTHROPLASTY  Date of procedure: 01/27/24   CHA2DS2-VASc Score = 3  This indicates a 3.2% annual risk of stroke. The patient's score is based upon: CHF History: 0 HTN History: 0 Diabetes History: 0 Stroke History: 0 Vascular Disease History: 0 Age Score: 2 Gender Score: 1    CrCl 58 ml/min Platelet count 175K   Per office protocol, patient can hold Eliquis for 3 days prior to procedure.     **This guidance is not considered finalized until pre-operative APP has relayed final recommendations.**

## 2023-10-28 NOTE — Telephone Encounter (Signed)
   Name: ELLEE WAWRZYNIAK  DOB: [birth year]  MRN: [id number]  Primary Cardiologist: Oneil Parchment, MD   Preoperative team, please contact this patient and set up a phone call appointment for further preoperative risk assessment. Please obtain consent and complete medication review. Thank you for your help.  Please schedule tele visit closer to 10/28.  I confirm that guidance regarding antiplatelet and oral anticoagulation therapy has been completed and, if necessary, noted below.  Per Pharm D, patient may hold Eliquis for 3 days prior to procedure.    I also confirmed the patient resides in the state of [place] . As per [facility] Medical Board telemedicine laws, the patient must reside in the state in which the provider is licensed.    Barnie Hila, NP 10/28/2023, 11:53 AM [facility]

## 2023-10-28 NOTE — Telephone Encounter (Signed)
  Patient Consent for Virtual Visit        Lindsay Ware has provided verbal consent on 10/28/2023 for a virtual visit (video or telephone).   CONSENT FOR VIRTUAL VISIT FOR:  Lindsay Ware  By participating in this virtual visit I agree to the following:  I hereby voluntarily request, consent and authorize [facility] and its employed or contracted physicians, physician assistants, nurse practitioners or other licensed health care professionals (the Practitioner), to provide me with telemedicine health care services (the "Services) as deemed necessary by the treating Practitioner. I acknowledge and consent to receive the Services by the Practitioner via telemedicine. I understand that the telemedicine visit will involve communicating with the Practitioner through live audiovisual communication technology and the disclosure of certain medical information by electronic transmission. I acknowledge that I have been given the opportunity to request an in-person assessment or other available alternative prior to the telemedicine visit and am voluntarily participating in the telemedicine visit.  I understand that I have the right to withhold or withdraw my consent to the use of telemedicine in the course of my care at any time, without affecting my right to future care or treatment, and that the Practitioner or I may terminate the telemedicine visit at any time. I understand that I have the right to inspect all information obtained and/or recorded in the course of the telemedicine visit and may receive copies of available information for a reasonable fee.  I understand that some of the potential risks of receiving the Services via telemedicine include:  Delay or interruption in medical evaluation due to technological equipment failure or disruption; Information transmitted may not be sufficient (e.g. poor resolution of images) to allow for appropriate medical decision making by the Practitioner;  and/or  In rare instances, security protocols could fail, causing a breach of personal health information.  Furthermore, I acknowledge that it is my responsibility to provide information about my medical history, conditions and care that is complete and accurate to the best of my ability. I acknowledge that Practitioner's advice, recommendations, and/or decision may be based on factors not within their control, such as incomplete or inaccurate data provided by me or distortions of diagnostic images or specimens that may result from electronic transmissions. I understand that the practice of medicine is not an exact science and that Practitioner makes no warranties or guarantees regarding treatment outcomes. I acknowledge that a copy of this consent can be made available to me via my patient portal [facility] MyChart), or I can request a printed copy by calling the office of [facility].    I understand that my insurance will be billed for this visit.   I have read or had this consent read to me. I understand the contents of this consent, which adequately explains the benefits and risks of the Services being provided via telemedicine.  I have been provided ample opportunity to ask questions regarding this consent and the Services and have had my questions answered to my satisfaction. I give my informed consent for the services to be provided through the use of telemedicine in my medical care

## 2023-10-28 NOTE — Telephone Encounter (Signed)
 Preop televisit now scheduled, med rec and consent done.

## 2023-12-29 ENCOUNTER — Other Ambulatory Visit: Payer: Self-pay | Admitting: Cardiology

## 2024-01-02 ENCOUNTER — Telehealth (HOSPITAL_BASED_OUTPATIENT_CLINIC_OR_DEPARTMENT_OTHER): Payer: Self-pay | Admitting: *Deleted

## 2024-01-02 NOTE — Telephone Encounter (Signed)
 Requesting office sent duplicate. Pt has tele preop appt 01/19/24. Once she has been cleared our team will fax clearance notes and any recommendations.

## 2024-01-07 NOTE — Telephone Encounter (Signed)
 SABRA

## 2024-01-16 ENCOUNTER — Encounter (HOSPITAL_COMMUNITY): Admission: RE | Admit: 2024-01-16 | Source: Ambulatory Visit

## 2024-01-19 ENCOUNTER — Ambulatory Visit: Attending: Cardiovascular Disease

## 2024-01-19 DIAGNOSIS — Z01810 Encounter for preprocedural cardiovascular examination: Secondary | ICD-10-CM

## 2024-01-19 NOTE — Telephone Encounter (Signed)
 Per preop APP Rosabel Mose, NP, see notes from today:      Patient Name: Lindsay Ware  DOB: Feb 19, 1936 MRN: [id number]   Primary Cardiologist: Oneil Parchment, MD   Chart reviewed as part of pre-operative protocol coverage.  Patient was contacted today regarding left total knee arthroplasty, patient reports that her procedure has been canceled.  She believes that procedure will take place sometime in late January.  Will cancel preoperative televisit today and no charge call.  Please notify requesting office that as her procedure has been canceled her televisit has been canceled, recommend resubmitting request closer to procedure.   Katlyn D West, NP 01/19/2024, 4:48 PM

## 2024-01-19 NOTE — Progress Notes (Signed)
   Patient Name: Lindsay Ware  DOB: April 12, 1935 MRN: [id number]  Primary Cardiologist: Oneil Parchment, MD  Chart reviewed as part of pre-operative protocol coverage.  Patient was contacted today regarding left total knee arthroplasty, patient reports that her procedure has been canceled.  She believes that procedure will take place sometime in late January.  Will cancel preoperative televisit today and no charge call.  Please notify requesting office that as her procedure has been canceled her televisit has been canceled, recommend resubmitting request closer to procedure.  Lindsay Levi [name], NP 01/19/2024, 4:48 PM

## 2024-01-20 DIAGNOSIS — Z01419 Encounter for gynecological examination (general) (routine) without abnormal findings: Secondary | ICD-10-CM | POA: Diagnosis not present

## 2024-01-20 DIAGNOSIS — R350 Frequency of micturition: Secondary | ICD-10-CM | POA: Diagnosis not present

## 2024-01-20 DIAGNOSIS — N3942 Incontinence without sensory awareness: Secondary | ICD-10-CM | POA: Diagnosis not present

## 2024-01-20 DIAGNOSIS — N958 Other specified menopausal and perimenopausal disorders: Secondary | ICD-10-CM | POA: Diagnosis not present

## 2024-01-20 DIAGNOSIS — L900 Lichen sclerosus et atrophicus: Secondary | ICD-10-CM | POA: Diagnosis not present

## 2024-01-20 DIAGNOSIS — R300 Dysuria: Secondary | ICD-10-CM | POA: Diagnosis not present

## 2024-01-20 DIAGNOSIS — N393 Stress incontinence (female) (male): Secondary | ICD-10-CM | POA: Diagnosis not present

## 2024-01-27 DIAGNOSIS — J069 Acute upper respiratory infection, unspecified: Secondary | ICD-10-CM | POA: Diagnosis not present

## 2024-02-02 DIAGNOSIS — I4819 Other persistent atrial fibrillation: Secondary | ICD-10-CM | POA: Diagnosis not present

## 2024-02-02 DIAGNOSIS — J0100 Acute maxillary sinusitis, unspecified: Secondary | ICD-10-CM | POA: Diagnosis not present

## 2024-02-02 DIAGNOSIS — J069 Acute upper respiratory infection, unspecified: Secondary | ICD-10-CM | POA: Diagnosis not present

## 2024-02-11 DIAGNOSIS — Z23 Encounter for immunization: Secondary | ICD-10-CM | POA: Diagnosis not present

## 2024-04-07 NOTE — Progress Notes (Addendum)
 Anesthesia Review:  PCP: Ethel Sauers at [facility] 03/26/24 in Media tab dated 04/13/24.  Cardiologist : Oneil Parchment PRe on 10/2025GLENWOOD Penner West,NP   PPM/ ICD: Device Orders: Rep Notified:  Chest x-ray : EKG : 09/18/23  Echo : 11/25/ Monitor- 04/14/23  Stress test: Cardiac Cath :  01/28/23   Activity level: can do a flight of stairs without difficulty  Sleep Study/ CPAP : none  Fasting Blood Sugar :      / Checks Blood Sugar -- times a day:    Blood Thinner/ Instructions /Last Dose: ASA / Instructions/ Last Dose :    Eliquis - last dose on 04/16/2024   03/24/24- PT states had cough and congestion.  Seen by Dr Ethel Sauers L[facility] 03/26/25.  PT staes she was negative for flu, covid, and RSV.  PT states she was treated with antibiotics.  PT still has slight residual cough at prep on 04/12/2024.  PT states  IT put me in afib.  PT was instructed to notify DR Ernie office and let them be aware of above.  Pt voiced understanding.  Called and requested LOV note on 04/12/24. ON 04/13/24 med records did not send correct note.  Have called back and [facility]MM and requested ov note in reference to URI around 03/24/2025. THE URI info is listed at bottom of ov note dated 03/26/25.  PT was placed on Erthryomycin and cough med.    PT also having issue with right hand and is being treated for carpal tunnel.  Pt states wearing a brace at nite which is helpful.

## 2024-04-08 NOTE — Patient Instructions (Signed)
 SURGICAL WAITING ROOM VISITATION  Patients having surgery or a procedure may have no more than 2 support people in the waiting area - these visitors may rotate.    Children ages 11 and under will not be able to visit patients in [facility] under most circumstances.   Visitors with respiratory illnesses are discouraged from visiting and should remain at home.  If the patient needs to stay at the hospital during part of their recovery, the visitor guidelines for inpatient rooms apply. Pre-op nurse will coordinate an appropriate time for 1 support person to accompany patient in pre-op.  This support person may not rotate.    Please refer to the [facility] website for the visitor guidelines for Inpatients (after your surgery is over and you are in a regular room).       Your procedure is scheduled on: 04/20/2024    Report to [facility] Main Entrance    Report to admitting at   0730AM   Call this number if you have problems the morning of surgery [phone]   Do not eat food :After Midnight.   After Midnight you may have the following liquids until _ 0700_____ AM DAY OF SURGERY  Water Non-Citrus Juices (without pulp, NO RED-Apple, White grape, White cranberry) Black Coffee (NO MILK/CREAM OR CREAMERS, sugar ok)  Clear Tea (NO MILK/CREAM OR CREAMERS, sugar ok) regular and decaf                             Plain Jell-O (NO RED)                                           Fruit ices (not with fruit pulp, NO RED)                                     Popsicles (NO RED)                                                               Sports drinks like Gatorade (NO RED)             The day of surgery:  Drink ONE (1) Pre-Surgery Clear Ensure or G2 at  0700 AM the morning of surgery. Drink in one sitting. Do not sip.  This drink was given to you during your hospital  pre-op appointment visit. Nothing else to drink after completing the  Pre-Surgery Clear Ensure or G2.           If you have questions, please contact your surgeons office.   FOLLOW BOWEL PREP AND ANY ADDITIONAL PRE OP INSTRUCTIONS YOU RECEIVED FROM YOUR SURGEON'S OFFICE!!!     Oral Hygiene is also important to reduce your risk of infection.                                    Remember - BRUSH YOUR TEETH THE MORNING OF SURGERY WITH YOUR REGULAR TOOTHPASTE  DENTURES WILL BE REMOVED  PRIOR TO SURGERY PLEASE DO NOT APPLY Poly grip OR ADHESIVES!!!   Do NOT smoke after Midnight   Stop all vitamins and herbal supplements 7 days before surgery.   Take these medicines the morning of surgery with A SIP OF WATER:  metoprolol  DO NOT TAKE ANY ORAL DIABETIC MEDICATIONS DAY OF YOUR SURGERY  Bring CPAP mask and tubing day of surgery.                              You may not have any metal on your body including hair pins, jewelry, and body piercing             Do not wear make-up, lotions, powders, perfumes/cologne, or deodorant  Do not wear nail polish including gel and S&S, artificial/acrylic nails, or any other type of covering on natural nails including finger and toenails. If you have artificial nails, gel coating, etc. that needs to be removed by a nail salon please have this removed prior to surgery or surgery may need to be canceled/ delayed if the surgeon/ anesthesia feels like they are unable to be safely monitored.   Do not shave  48 hours prior to surgery.               Men may shave face and neck.   Do not bring valuables to the hospital. [facility] IS NOT             RESPONSIBLE   FOR VALUABLES.   Contacts, glasses, dentures or bridgework may not be worn into surgery.   Bring small overnight bag day of surgery.   DO NOT BRING YOUR HOME MEDICATIONS TO THE HOSPITAL. PHARMACY WILL DISPENSE MEDICATIONS LISTED ON YOUR MEDICATION LIST TO YOU DURING YOUR ADMISSION IN THE HOSPITAL!    Patients discharged on the day of surgery will not be allowed to drive home.  Someone NEEDS to stay  with you for the first 24 hours after anesthesia.   Special Instructions: Bring a copy of your healthcare power of attorney and living will documents the day of surgery if you haven't scanned them before.              Please read over the following fact sheets you were given: IF YOU HAVE QUESTIONS ABOUT YOUR PRE-OP INSTRUCTIONS PLEASE CALL [phone].   If you received a COVID test during your pre-op visit  it is requested that you wear a mask when out in public, stay away from anyone that may not be feeling well and notify your surgeon if you develop symptoms. If you test positive for Covid or have been in contact with anyone that has tested positive in the last 10 days please notify you surgeon.      Pre-operative 4 CHG Bath Instructions   You can play a key role in reducing the risk of infection after surgery. Your skin needs to be as free of germs as possible. You can reduce the number of germs on your skin by washing with CHG (chlorhexidine  gluconate) soap before surgery. CHG is an antiseptic soap that kills germs and continues to kill germs even after washing.   DO NOT use if you have an allergy to chlorhexidine /CHG or antibacterial soaps. If your skin becomes reddened or irritated, stop using the CHG and notify one of our RNs at [phone].   Please shower with the CHG soap starting 4 days before surgery using the following schedule:  Please keep in mind the following:  DO NOT shave, including legs and underarms, starting the day of your first shower.   You may shave your face at any point before/day of surgery.  Place clean sheets on your bed the day you start using CHG soap. Use a clean washcloth (not used since being washed) for each shower. DO NOT sleep with pets once you start using the CHG.   CHG Shower Instructions:  If you choose to wash your hair and private area, wash first with your normal shampoo/soap.  After you use shampoo/soap, rinse your hair and body  thoroughly to remove shampoo/soap residue.  Turn the water OFF and apply about 3 tablespoons (45 ml) of CHG soap to a CLEAN washcloth.  Apply CHG soap ONLY FROM YOUR NECK DOWN TO YOUR TOES (washing for 3-5 minutes)  DO NOT use CHG soap on face, private areas, open wounds, or sores.  Pay special attention to the area where your surgery is being performed.  If you are having back surgery, having someone wash your back for you may be helpful. Wait 2 minutes after CHG soap is applied, then you may rinse off the CHG soap.  Pat dry with a clean towel  Put on clean clothes/pajamas   If you choose to wear lotion, please use ONLY the CHG-compatible lotions on the back of this paper.     Additional instructions for the day of surgery: DO NOT APPLY any lotions, deodorants, cologne, or perfumes.   Put on clean/comfortable clothes.  Brush your teeth.  Ask your nurse before applying any prescription medications to the skin.      CHG Compatible Lotions   Aveeno Moisturizing lotion  Cetaphil Moisturizing Cream  Cetaphil Moisturizing Lotion  Clairol Herbal Essence Moisturizing Lotion, Dry Skin  Clairol Herbal Essence Moisturizing Lotion, Extra Dry Skin  Clairol Herbal Essence Moisturizing Lotion, Normal Skin  Curel Age Defying Therapeutic Moisturizing Lotion with Alpha Hydroxy  Curel Extreme Care Body Lotion  Curel Soothing Hands Moisturizing Hand Lotion  Curel Therapeutic Moisturizing Cream, Fragrance-Free  Curel Therapeutic Moisturizing Lotion, Fragrance-Free  Curel Therapeutic Moisturizing Lotion, Original Formula  Eucerin Daily Replenishing Lotion  Eucerin Dry Skin Therapy Plus Alpha Hydroxy Crme  Eucerin Dry Skin Therapy Plus Alpha Hydroxy Lotion  Eucerin Original Crme  Eucerin Original Lotion  Eucerin Plus Crme Eucerin Plus Lotion  Eucerin TriLipid Replenishing Lotion  Keri Anti-Bacterial Hand Lotion  Keri Deep Conditioning Original Lotion Dry Skin Formula Softly Scented  Keri  Deep Conditioning Original Lotion, Fragrance Free Sensitive Skin Formula  Keri Lotion Fast Absorbing Fragrance Free Sensitive Skin Formula  Keri Lotion Fast Absorbing Softly Scented Dry Skin Formula  Keri Original Lotion  Keri Skin Renewal Lotion Keri Silky Smooth Lotion  Keri Silky Smooth Sensitive Skin Lotion  Nivea Body Creamy Conditioning Oil  Nivea Body Extra Enriched Teacher, Adult Education Moisturizing Lotion Nivea Crme  Nivea Skin Firming Lotion  NutraDerm 30 Skin Lotion  NutraDerm Skin Lotion  NutraDerm Therapeutic Skin Cream  NutraDerm Therapeutic Skin Lotion  ProShield Protective Hand Cream  Provon moisturizing lotion

## 2024-04-12 ENCOUNTER — Other Ambulatory Visit: Payer: Self-pay

## 2024-04-12 ENCOUNTER — Encounter (HOSPITAL_COMMUNITY): Payer: Self-pay

## 2024-04-12 ENCOUNTER — Encounter (HOSPITAL_COMMUNITY)
Admission: RE | Admit: 2024-04-12 | Discharge: 2024-04-12 | Disposition: A | Source: Ambulatory Visit | Attending: Orthopedic Surgery

## 2024-04-12 VITALS — BP 134/79 | HR 58 | Temp 98.6°F | Resp 16 | Ht 66.0 in | Wt 131.0 lb

## 2024-04-12 DIAGNOSIS — Z01812 Encounter for preprocedural laboratory examination: Secondary | ICD-10-CM | POA: Diagnosis present

## 2024-04-12 DIAGNOSIS — Z01818 Encounter for other preprocedural examination: Secondary | ICD-10-CM

## 2024-04-12 HISTORY — DX: Cardiac murmur, unspecified: R01.1

## 2024-04-12 HISTORY — DX: Unspecified osteoarthritis, unspecified site: M19.90

## 2024-04-12 LAB — CBC
HCT: 41.4 % (ref 36.0–46.0)
Hemoglobin: 13.8 g/dL (ref 12.0–15.0)
MCH: 31.8 pg (ref 26.0–34.0)
MCHC: 33.3 g/dL (ref 30.0–36.0)
MCV: 95.4 fL (ref 80.0–100.0)
Platelets: 219 K/uL (ref 150–400)
RBC: 4.34 MIL/uL (ref 3.87–5.11)
RDW: 12.6 % (ref 11.5–15.5)
WBC: 5.9 K/uL (ref 4.0–10.5)
nRBC: 0 % (ref 0.0–0.2)

## 2024-04-12 LAB — BASIC METABOLIC PANEL WITH GFR
Anion gap: 9 (ref 5–15)
BUN: 17 mg/dL (ref 8–23)
CO2: 26 mmol/L (ref 22–32)
Calcium: 9.7 mg/dL (ref 8.9–10.3)
Chloride: 103 mmol/L (ref 98–111)
Creatinine, Ser: 0.61 mg/dL (ref 0.44–1.00)
GFR, Estimated: >60 mL/min
Glucose, Bld: 105 mg/dL — ABNORMAL HIGH (ref 70–99)
Potassium: 4.3 mmol/L (ref 3.5–5.1)
Sodium: 139 mmol/L (ref 135–145)

## 2024-04-12 LAB — SURGICAL PCR SCREEN
MRSA, PCR: NEGATIVE
Staphylococcus aureus: NEGATIVE

## 2024-04-13 NOTE — Progress Notes (Signed)
 " Case: [id number] Date/Time: 04/20/24 0945   Procedure: ARTHROPLASTY, KNEE, TOTAL (Left: Knee)   Anesthesia type: Spinal   Diagnosis: Primary osteoarthritis of left knee [M17.12]   Pre-op diagnosis: Left knee osteoarthritis   Location: [facility] ROOM 09 / [facility]   Surgeons: Lindsay Cough, MD       DISCUSSION: Lindsay Ware is an 89 yo female with PMH of former smoking, A.fib on Eliquis, MVP with mild-mod mitral regurg, moderate aortic stenosis, PMR, arthritis.  Patient seen by PCP on 12/26 for routine f/u. She reported hand numbness and was referred to hand surgery for possible carpal tunnel. Also noted to have a URI. Symptoms started on 12/24. She tested negative for COVID, RSV, Flu.   Patient follows with Cardiology for A.fib and aortic stenosis. Last seen on 09/18/23 by Dr. Jeffrie. Noted to be stable on current medicines. Last echo in 01/2023 showed normal LVEF, MVP with mild-mod MR, moderate AS with mean gradient 16.2 mmHg. Advised f/u in 1 year and have Echo repeated then. At PAT visit patient reports she can do a flight of stairs without difficulty  LD Eliquis: 1/16   VS: BP 134/79   Pulse (!) 58   Temp 37 C (Oral)   Resp 16   Ht 5' 6 (1.676 m)   Wt 59.4 kg   SpO2 100%   BMI 21.14 kg/m   PROVIDERS: Lindsay Ware LABOR, MD   LABS: Labs reviewed: Acceptable for surgery. (all labs ordered are listed, but only abnormal results are displayed)  Labs Reviewed  BASIC METABOLIC PANEL WITH GFR - Abnormal; Notable for the following components:      Result Value   Glucose, Bld 105 (*)    All other components within normal limits  SURGICAL PCR SCREEN  CBC    Event monitor 05/01/23:    Sinus rhythm average heart rate 57 bpm with first-degree AV block   Rare episodes of atrial tachycardia   Atrial fibrillation occurred less than 1% with average heart rate of 95 bpm ranging from 81 to 108 bpm the longest episode lasted 1 minute and 24 seconds   Occasional PACs at 2.1%, occasional PVCs  at 2.5%   No significant pauses   Echo 02/24/23:   IMPRESSIONS    1. Left ventricular ejection fraction, by estimation, is 55 to 60%. The left ventricle has normal function. The left ventricle has no regional wall motion abnormalities. Left ventricular diastolic parameters are indeterminate.  2. Right ventricular systolic function is normal. The right ventricular size is normal.  3. Left atrial size was moderately dilated.  4. Right atrial size was severely dilated.  5. Thickened and myxomatoius mild posterior prolapse. The mitral valve is myxomatous. Mild to moderate mitral valve regurgitation. No evidence of mitral stenosis.  6. Tricuspid valve regurgitation is moderate.  7. The aortic valve is tricuspid. There is severe calcifcation of the aortic valve. There is severe thickening of the aortic valve. Aortic valve regurgitation is mild. Moderate aortic valve stenosis.  8. The inferior vena cava is dilated in size with >50% respiratory variability, suggesting right atrial pressure of 8 mmHg.  Past Medical History:  Diagnosis Date   Arthritis    Diastolic dysfunction    Goiter    H/O left knee surgery    Heart murmur    MCI (mild cognitive impairment)    Memory loss    Memory loss    Mitral regurgitation    Nonrheumatic aortic (valve) stenosis    Persistent atrial fibrillation (  HCC)    PMR (polymyalgia rheumatica)    Vaginal atrophy    Vitamin B12 deficiency     Past Surgical History:  Procedure Laterality Date   ABDOMINAL HYSTERECTOMY     BREAST CYST EXCISION Right    BREAST LUMPECTOMY     CARDIOVERSION N/A 01/28/2023   Procedure: CARDIOVERSION;  Surgeon: Lindsay Pugh, DO;  Location[facility];  Service: Cardiovascular;  Laterality: N/A;   KNEE SURGERY     LAPAROSCOPIC ENDOMETRIOSIS FULGURATION     LUMBAR DISC SURGERY     RIGHT CATARACT     RIGHT SHOULDER SURGERY      MEDICATIONS:  acetaminophen (TYLENOL) 500 MG tablet   ADVAIR DISKUS 100-50  MCG/DOSE AEPB   ascorbic acid (VITAMIN C) 500 MG tablet   cetirizine (ZYRTEC ALLERGY) 10 MG tablet   Cholecalciferol (VITAMIN D) 50 MCG (2000 UT) CAPS   clobetasol ointment (TEMOVATE) 0.05 %   dextromethorphan-guaiFENesin (MUCINEX DM) 30-600 MG 12hr tablet   ELIQUIS 5 MG TABS tablet   EPINEPHrine (EPIPEN 2-PAK) 0.3 mg/0.3 mL IJ SOAJ injection   estradiol (ESTRACE) 0.1 MG/GM vaginal cream   metoprolol tartrate (LOPRESSOR) 25 MG tablet   No current facility-administered medications for this encounter.   Lindsay Ware [facility] Short Stay/Anesthesiology [facility] Phone [phone] 04/15/2024 10:08 AM        "

## 2024-04-15 NOTE — Anesthesia Preprocedure Evaluation (Signed)
"                                    Anesthesia Evaluation    Airway        Dental   Pulmonary former smoker          Cardiovascular      Neuro/Psych    GI/Hepatic   Endo/Other    Renal/GU      Musculoskeletal   Abdominal   Peds  Hematology   Anesthesia Other Findings   Reproductive/Obstetrics                              Anesthesia Physical Anesthesia Plan  ASA:   Anesthesia Plan:    Post-op Pain Management:    Induction:   PONV Risk Score and Plan:   Airway Management Planned:   Additional Equipment:   Intra-op Plan:   Post-operative Plan:   Informed Consent:   Plan Discussed with:   Anesthesia Plan Comments: (See PAT note from 1/12)         Anesthesia Quick Evaluation  "

## 2024-04-16 ENCOUNTER — Telehealth: Payer: Self-pay

## 2024-04-16 ENCOUNTER — Telehealth (HOSPITAL_BASED_OUTPATIENT_CLINIC_OR_DEPARTMENT_OTHER): Payer: Self-pay | Admitting: *Deleted

## 2024-04-16 ENCOUNTER — Ambulatory Visit

## 2024-04-16 DIAGNOSIS — Z01810 Encounter for preprocedural cardiovascular examination: Secondary | ICD-10-CM

## 2024-04-16 NOTE — Telephone Encounter (Signed)
 Spoke to Amasa at [place] and informed her of the patient currently in Afib and experiencing SOB. I informed her that surgery is going to have to be post-pone until we can see her in office and evaluate her.

## 2024-04-16 NOTE — Telephone Encounter (Signed)
 Our office received duplicate request. Procedure was NOT done orginally when planned in 12/2023. Procedure is no planned for 04/20/24; clearance request has been reviewed from original, ALL information is the same; only change the date of surgery is now 04/20/24.   I will send to preop APP for review.

## 2024-04-16 NOTE — Progress Notes (Signed)
 "   Virtual Visit via Telephone Note   Because of BRITTINEE RISK co-morbid illnesses, she is at least at moderate risk for complications without adequate follow up.  This format is felt to be most appropriate for this patient at this time.  Due to technical limitations with video connection web designer), today's appointment will be conducted as an audio only telehealth visit, and ZAFIRO ROUTSON verbally agreed to proceed in this manner.   All issues noted in this document were discussed and addressed.  No physical exam could be performed with this format.  Evaluation Performed:  Preoperative cardiovascular risk assessment _____________   Date:  04/16/2024   Patient ID:  Lindsay Ware, DOB February 18, 1936, MRN [id number] Patient Location:  Home Provider location:   Office  Primary Care Provider:  Charlott Dorn LABOR, MD Primary Cardiologist:  Oneil Parchment, MD  Chief Complaint / Patient Profile   89 y.o. y/o female with a h/o atrial fibrillation, moderate aortic valve stenosis, mitral valve prolapse with regurgitation, polymyalgia rheumatica who is pending left total knee arthroplasty on 04/20/2024 with [facility] and presents today for telephonic preoperative cardiovascular risk assessment.  History of Present Illness    Lindsay Ware is a 89 y.o. female who presents via audio/video conferencing for a telehealth visit today.  Pt was last seen in cardiology clinic on 09/18/2023 by Dr. Parchment.  At that time Lindsay Ware was doing well.  The patient is now pending procedure as outlined above. Since her last visit, she tells me she has been experiencing an increase in her atrial fibrillation over the past week.  Over the phone she used her Crist mobile device which told her that she was actively in atrial fibrillation.  Her heart rate was normal ranging in the 80s bpm.  She does deny any chest pressure but does report that she is short of breath.  She denies orthopnea, PND, LEE, syncope, presyncope,  lightheadedness, or dizziness.  Past Medical History    Past Medical History:  Diagnosis Date   Arthritis    Diastolic dysfunction    Goiter    H/O left knee surgery    Heart murmur    MCI (mild cognitive impairment)    Memory loss    Memory loss    Mitral regurgitation    Nonrheumatic aortic (valve) stenosis    Persistent atrial fibrillation (HCC)    PMR (polymyalgia rheumatica)    Vaginal atrophy    Vitamin B12 deficiency    Past Surgical History:  Procedure Laterality Date   ABDOMINAL HYSTERECTOMY     BREAST CYST EXCISION Right    BREAST LUMPECTOMY     CARDIOVERSION N/A 01/28/2023   Procedure: CARDIOVERSION;  Surgeon: Sheena Pugh, DO;  Location[facility];  Service: Cardiovascular;  Laterality: N/A;   KNEE SURGERY     LAPAROSCOPIC ENDOMETRIOSIS FULGURATION     LUMBAR DISC SURGERY     RIGHT CATARACT     RIGHT SHOULDER SURGERY      Allergies  Allergies[1]  Home Medications    Prior to Admission medications  Medication Sig Start Date End Date Taking? Authorizing Provider  acetaminophen (TYLENOL) 500 MG tablet Take 1,000 mg by mouth every 6 (six) hours as needed for moderate pain (pain score 4-6).    [provider]  ADVAIR DISKUS 100-50 MCG/DOSE AEPB Inhale 1 puff into the lungs daily as needed (Shortness of breath). 07/09/18   [provider]  ascorbic acid (VITAMIN C) 500 MG tablet  Take 500 mg by mouth daily.    [provider]  cetirizine (ZYRTEC ALLERGY) 10 MG tablet Take 10 mg by mouth daily as needed for allergies.    [provider]  Cholecalciferol (VITAMIN D) 50 MCG (2000 UT) CAPS Take 2,000 Units by mouth daily.    [provider]  clobetasol ointment (TEMOVATE) 0.05 % 1 Application as needed (Vaginal itching). Vaginal itching 12/30/22   [provider]  dextromethorphan-guaiFENesin (MUCINEX DM) 30-600 MG 12hr tablet Take 1 tablet by mouth 2 (two) times daily as needed for cough (Congestion).     [provider]  ELIQUIS 5 MG TABS tablet Take 5 mg by mouth 2 (two) times daily. 01/22/23   [provider]  EPINEPHrine (EPIPEN 2-PAK) 0.3 mg/0.3 mL IJ SOAJ injection Inject 0.3 mg into the muscle as needed for anaphylaxis. Allergic reaction    [provider]  estradiol (ESTRACE) 0.1 MG/GM vaginal cream Place 1 Applicatorful vaginally as needed (atrophy). 11/11/22   [provider]  metoprolol tartrate (LOPRESSOR) 25 MG tablet TAKE 1/2 TABLET BY MOUTH TWICE A DAY 12/29/23   Jeffrie Oneil BROCKS, MD    Physical Exam    Vital Signs:  ELDINE RENCHER does not have vital signs available for review today  Given telephonic nature of communication, physical exam is limited. AAOx3. NAD. Normal affect.  Speech and respirations are unlabored.  Accessory Clinical Findings    None  Assessment & Plan    1.  Preoperative Cardiovascular Risk Assessment:  Today patient tells me she has been experiencing increased episodes of atrial fibrillation.  She feels that her heart is out of rhythm.  She used her Crist mobile device over the televisit today that notified her that she was in atrial fibrillation with a controlled rate ranging in the 80s bpm.  She also tells me she is experiencing some shortness of breath.  She reports symptoms started after a recent URI.  Currently unable to clear patient over televisit.  She will require an office visit for further evaluation as well as EKG. She also has history of moderate aortic stenosis with most recent echocardiogram in 01/2023.  May require repeat echo prior to surgery.  ED precautions given.  The patient was advised that if she develops new symptoms prior to surgery to contact our office to arrange for a follow-up visit, and she verbalized understanding.   A copy of this note will be routed to requesting surgeon.  Time:   Today, I have spent 15 minutes with the patient with telehealth technology discussing medical history,  symptoms, and management plan.     Lum Ware Louis, NP  04/16/2024, 3:19 PM      [1]  Allergies Allergen Reactions   Bee Venom Anaphylaxis   Sulfa Antibiotics Nausea And Vomiting and Rash   "

## 2024-04-16 NOTE — Telephone Encounter (Signed)
 I will confirm with preop APP if we are adding the pt on today as procedure is 04/20/24 and pt need med hold as well.

## 2024-04-16 NOTE — Telephone Encounter (Signed)
 I d/w the preop APP about adding pt on today. Ok per [employer]. NP to add on.   Pt added on 2:40 today, med rec and consent are done. Pt agreeable to tele preop appt.   New date for surgery is 04/20/24  See the notes from earlier today, same request, just new date.      Pre-operative Risk Assessment    Patient Name: Lindsay Ware  DOB: 04-Dec-1935 MRN: [id number]   Date of last office visit: 09/18/23 DR. SKAINS Date of next office visit: NONE   Request for Surgical Clearance    Procedure:  LEFT TOTAL KNEE ARTHROPLASTY   Date of Surgery:  Clearance 01/27/24                                  Surgeon:  DR. MATTHEW OLIN Surgeon's Group or Practice Name:  [facility] Phone number:  [phone] [facility] Fax number:  [phone]   Type of Clearance Requested:   - Medical  - Pharmacy:  Hold Apixaban (Eliquis)     Type of Anesthesia:  Spinal   Additional requests/questions:

## 2024-04-16 NOTE — Telephone Encounter (Signed)
 Called the patient and informed her that we needed to reschedule her pre-op clearance due to her in Afib and experiencing shortness of breath. She said I am fine! Sure I'm short of breath because I'm mad this is getting rescheduled now! I will call the surgeon and tell them I am fine! It is your fault because we waited till the last minute to call me to give this clearance! I explained to her I was sorry this had happened and that her surgeon's office had already been called and informed of her current symptoms. She was very upset this had been done. She explained that Darryle Long had already told her Monday 04/12/24 that she was fine to proceed with her surgery. She has now been rescheduled for clearance on 05/03/24 with Lum Louis, NP.

## 2024-04-16 NOTE — Telephone Encounter (Signed)
" ° °  Name: Lindsay Ware  DOB: 1935-10-03  MRN: [id number]  Primary Cardiologist: Oneil Parchment, MD   Preoperative team, please contact this patient and set up a phone call appointment for further preoperative risk assessment. Please obtain consent and complete medication review. Thank you for your help.  I confirm that guidance regarding antiplatelet and oral anticoagulation therapy has been completed and, if necessary, noted below:  Per Pharmacy and office protocol, patient can hold Eliquis for 3 days prior to procedure.    I also confirmed the patient resides in the state of [place] . As per [facility] Medical Board telemedicine laws, the patient must reside in the state in which the provider is licensed.   Riccardo Holeman E Alayshia Marini, PA-C 04/16/2024, 10:06 AM [facility]    "

## 2024-04-16 NOTE — Telephone Encounter (Signed)
 I d/w the preop APP about adding pt on today. Ok per [employer]. NP to add on.   Pt added on 2:40 today, med rec and consent are done. Pt agreeable to tele preop appt.     See the notes from earlier today, same request, just new date.      Pre-operative Risk Assessment    Patient Name: Lindsay Ware  DOB: 1935/11/12 MRN: [id number]   Date of last office visit: 09/18/23 DR. SKAINS Date of next office visit: NONE   Request for Surgical Clearance    Procedure:  LEFT TOTAL KNEE ARTHROPLASTY   Date of Surgery:  Clearance 01/27/24                                  Surgeon:  DR. MATTHEW OLIN Surgeon's Group or Practice Name:  [facility] Phone number:  [phone] [facility] Fax number:  [phone]   Type of Clearance Requested:   - Medical  - Pharmacy:  Hold Apixaban (Eliquis)     Type of Anesthesia:  Spinal   Additional requests/questions:

## 2024-05-03 ENCOUNTER — Ambulatory Visit: Admitting: Emergency Medicine

## 2024-05-03 ENCOUNTER — Encounter: Payer: Self-pay | Admitting: Emergency Medicine

## 2024-05-03 VITALS — BP 138/76 | HR 76 | Ht 66.0 in | Wt 135.0 lb

## 2024-05-03 DIAGNOSIS — Z01810 Encounter for preprocedural cardiovascular examination: Secondary | ICD-10-CM | POA: Diagnosis not present

## 2024-05-03 DIAGNOSIS — I4819 Other persistent atrial fibrillation: Secondary | ICD-10-CM

## 2024-05-03 DIAGNOSIS — I493 Ventricular premature depolarization: Secondary | ICD-10-CM

## 2024-05-03 DIAGNOSIS — I350 Nonrheumatic aortic (valve) stenosis: Secondary | ICD-10-CM

## 2024-05-03 DIAGNOSIS — I340 Nonrheumatic mitral (valve) insufficiency: Secondary | ICD-10-CM | POA: Diagnosis not present

## 2024-05-03 MED ORDER — METOPROLOL TARTRATE 25 MG PO TABS: 25.0000 mg | ORAL_TABLET | Freq: Two times a day (BID) | ORAL | 3 refills | Status: AC

## 2024-05-03 NOTE — Telephone Encounter (Signed)
 Pt in office at visit. Closing encounter

## 2024-05-04 ENCOUNTER — Ambulatory Visit

## 2024-05-04 ENCOUNTER — Ambulatory Visit (HOSPITAL_COMMUNITY): Admission: RE | Admit: 2024-05-04 | Discharge: 2024-05-04 | Attending: Internal Medicine

## 2024-05-04 DIAGNOSIS — I350 Nonrheumatic aortic (valve) stenosis: Secondary | ICD-10-CM | POA: Diagnosis not present

## 2024-05-04 DIAGNOSIS — I493 Ventricular premature depolarization: Secondary | ICD-10-CM

## 2024-05-04 DIAGNOSIS — I4819 Other persistent atrial fibrillation: Secondary | ICD-10-CM

## 2024-05-04 DIAGNOSIS — I340 Nonrheumatic mitral (valve) insufficiency: Secondary | ICD-10-CM

## 2024-05-04 LAB — ECHOCARDIOGRAM COMPLETE
AR max vel: 0.78 cm2
AV Area VTI: 0.75 cm2
AV Area mean vel: 0.74 cm2
AV Mean grad: 14 mmHg
AV Peak grad: 26.1 mmHg
Ao pk vel: 2.56 m/s
Area-P 1/2: 2.5 cm2
S' Lateral: 2.8 cm

## 2024-05-04 NOTE — Progress Notes (Unsigned)
 Enrolled patient for a 7 day Zio XT monitor to be mailed to patients home   Skains to read

## 2024-05-06 ENCOUNTER — Ambulatory Visit: Payer: Self-pay | Admitting: Cardiology

## 2024-05-06 NOTE — Progress Notes (Signed)
 Date of COVID positive in last 90 days:  PCP - Dorn Sauers, MD Cardiologist - Oneil Parchment, MD  Cardiac clearance by Lum Louis, NP 05/03/24 Epic  Chest x-ray - N/A EKG - 05/03/24 Epic Stress Test - N/A ECHO - 05/04/24 Epic Cardiac Cath - N/A Pacemaker/ICD device last checked:N/A Spinal Cord Stimulator:N/A  Bowel Prep - N/A  Sleep Study - N/A CPAP -   Fasting Blood Sugar - N/A Checks Blood Sugar _____ times a day  Last dose of GLP1 agonist-  N/A GLP1 instructions:  Do not take after     Last dose of SGLT-2 inhibitors-  N/A SGLT-2 instructions:  Do not take after     Blood Thinner Instructions: Eliquis, hold 3 days Aspirin Instructions:N/A Last Dose: 05/14/24 1800  Activity level: Can go up a flight of stairs and perform activities of daily living without stopping and without symptoms of chest pain or shortness of breath.  Anesthesia review: heart murmur, mitral regurgitation, a fib,   Patient denies shortness of breath, fever, cough and chest pain at PAT appointment  Patient verbalized understanding of instructions that were given to them at the PAT appointment. Patient was also instructed that they will need to review over the PAT instructions again at home before surgery.

## 2024-05-06 NOTE — Patient Instructions (Signed)
 SURGICAL WAITING ROOM VISITATION  Patients having surgery or a procedure may have no more than 2 support people in the waiting area - these visitors may rotate.    Children ages 57 and under will not be able to visit patients in [facility] under most circumstances.   Visitors with respiratory illnesses are discouraged from visiting and should remain at home.  If the patient needs to stay at the hospital during part of their recovery, the visitor guidelines for inpatient rooms apply. Pre-op nurse will coordinate an appropriate time for 1 support person to accompany patient in pre-op.  This support person may not rotate.    Please refer to the [facility] website for the visitor guidelines for Inpatients (after your surgery is over and you are in a regular room).    Your procedure is scheduled on: 05/18/24   Report to [facility] Main Entrance    Report to admitting at 11:50 AM   Call this number if you have problems the morning of surgery [phone]   Do not eat food :After Midnight.   After Midnight you may have the following liquids until 11:20 AM DAY OF SURGERY  Water Non-Citrus Juices (without pulp, NO RED-Apple, White grape, White cranberry) Black Coffee (NO MILK/CREAM OR CREAMERS, sugar ok)  Clear Tea (NO MILK/CREAM OR CREAMERS, sugar ok) regular and decaf                             Plain Jell-O (NO RED)                                           Fruit ices (not with fruit pulp, NO RED)                                     Popsicles (NO RED)                                                               Sports drinks like Gatorade (NO RED)     The day of surgery:  Drink ONE (1) Pre-Surgery Clear Ensure or G2 at 11:20 AM the morning of surgery. Drink in one sitting. Do not sip.  This drink was given to you during your hospital  pre-op appointment visit. Nothing else to drink after completing the  Pre-Surgery Clear Ensure or G2.          If you have  questions, please contact your surgeons office.   FOLLOW BOWEL PREP AND ANY ADDITIONAL PRE OP INSTRUCTIONS YOU RECEIVED FROM YOUR SURGEON'S OFFICE!!!     Oral Hygiene is also important to reduce your risk of infection.                                    Remember - BRUSH YOUR TEETH THE MORNING OF SURGERY WITH YOUR REGULAR TOOTHPASTE  DENTURES WILL BE REMOVED PRIOR TO SURGERY PLEASE DO NOT APPLY Poly grip OR ADHESIVES!!!   Do NOT  smoke after Midnight   Stop all vitamins and herbal supplements 7 days before surgery.   Take these medicines the morning of surgery with A SIP OF WATER:  Tylenol Inhalers Cetirizine (Zyrtec) Metoprolol  Tartrate (Lopressor )   DO NOT TAKE ANY ORAL DIABETIC MEDICATIONS DAY OF YOUR SURGERY  Bring CPAP mask and tubing day of surgery.                              You may not have any metal on your body including hair pins, jewelry, and body piercing             Do not wear make-up, lotions, powders, perfumes, or deodorant  Do not wear nail polish including gel and S&S, artificial/acrylic nails, or any other type of covering on natural nails including finger and toenails. If you have artificial nails, gel coating, etc. that needs to be removed by a nail salon please have this removed prior to surgery or surgery may need to be canceled/ delayed if the surgeon/ anesthesia feels like they are unable to be safely monitored.   Do not shave  48 hours prior to surgery.    Do not bring valuables to the hospital. [facility] IS NOT             RESPONSIBLE   FOR VALUABLES.   Contacts, glasses, dentures or bridgework may not be worn into surgery.   Bring small overnight bag day of surgery.   DO NOT BRING YOUR HOME MEDICATIONS TO THE HOSPITAL. PHARMACY WILL DISPENSE MEDICATIONS LISTED ON YOUR MEDICATION LIST TO YOU DURING YOUR ADMISSION IN THE HOSPITAL!   Special Instructions: Bring a copy of your healthcare power of attorney and living will documents the day of  surgery if you haven't scanned them before.              Please read over the following fact sheets you were given: IF YOU HAVE QUESTIONS ABOUT YOUR PRE-OP INSTRUCTIONS PLEASE CALL [phone] [place].   If you received a COVID test during your pre-op visit  it is requested that you wear a mask when out in public, stay away from anyone that may not be feeling well and notify your surgeon if you develop symptoms. If you test positive for Covid or have been in contact with anyone that has tested positive in the last 10 days please notify you surgeon.      Pre-operative 4 CHG Bath Instructions  DYNA-Hex 4 Chlorhexidine  Gluconate 4% Solution Antiseptic 4 fl. oz   You can play a key role in reducing the risk of infection after surgery. Your skin needs to be as free of germs as possible. You can reduce the number of germs on your skin by washing with CHG (chlorhexidine  gluconate) soap before surgery. CHG is an antiseptic soap that kills germs and continues to kill germs even after washing.   DO NOT use if you have an allergy to chlorhexidine /CHG or antibacterial soaps. If your skin becomes reddened or irritated, stop using the CHG and notify one of our RNs at   Please shower with the CHG soap starting 4 days before surgery using the following schedule:     Please keep in mind the following:  DO NOT shave, including legs and underarms, starting the day of your first shower.   You may shave your face at any point before/day of surgery.  Place clean sheets on your bed the day you  start using CHG soap. Use a clean washcloth (not used since being washed) for each shower. DO NOT sleep with pets once you start using the CHG.  CHG Shower Instructions:  If you choose to wash your hair and private area, wash first with your normal shampoo/soap.  After you use shampoo/soap, rinse your hair and body thoroughly to remove shampoo/soap residue.  Turn the water OFF and apply about 3 tablespoons (45 ml) of  CHG soap to a CLEAN washcloth.  Apply CHG soap ONLY FROM YOUR NECK DOWN TO YOUR TOES (washing for 3-5 minutes)  DO NOT use CHG soap on face, private areas, open wounds, or sores.  Pay special attention to the area where your surgery is being performed.  If you are having back surgery, having someone wash your back for you may be helpful. Wait 2 minutes after CHG soap is applied, then you may rinse off the CHG soap.  Pat dry with a clean towel  Put on clean clothes/pajamas   If you choose to wear lotion, please use ONLY the CHG-compatible lotions on the back of this paper.     Additional instructions for the day of surgery: DO NOT APPLY any lotions, deodorants, cologne, or perfumes.   Put on clean/comfortable clothes.  Brush your teeth.  Ask your nurse before applying any prescription medications to the skin.   CHG Compatible Lotions   Aveeno Moisturizing lotion  Cetaphil Moisturizing Cream  Cetaphil Moisturizing Lotion  Clairol Herbal Essence Moisturizing Lotion, Dry Skin  Clairol Herbal Essence Moisturizing Lotion, Extra Dry Skin  Clairol Herbal Essence Moisturizing Lotion, Normal Skin  Curel Age Defying Therapeutic Moisturizing Lotion with Alpha Hydroxy  Curel Extreme Care Body Lotion  Curel Soothing Hands Moisturizing Hand Lotion  Curel Therapeutic Moisturizing Cream, Fragrance-Free  Curel Therapeutic Moisturizing Lotion, Fragrance-Free  Curel Therapeutic Moisturizing Lotion, Original Formula  Eucerin Daily Replenishing Lotion  Eucerin Dry Skin Therapy Plus Alpha Hydroxy Crme  Eucerin Dry Skin Therapy Plus Alpha Hydroxy Lotion  Eucerin Original Crme  Eucerin Original Lotion  Eucerin Plus Crme Eucerin Plus Lotion  Eucerin TriLipid Replenishing Lotion  Keri Anti-Bacterial Hand Lotion  Keri Deep Conditioning Original Lotion Dry Skin Formula Softly Scented  Keri Deep Conditioning Original Lotion, Fragrance Free Sensitive Skin Formula  Keri Lotion Fast Absorbing Fragrance  Free Sensitive Skin Formula  Keri Lotion Fast Absorbing Softly Scented Dry Skin Formula  Keri Original Lotion  Keri Skin Renewal Lotion Keri Silky Smooth Lotion  Keri Silky Smooth Sensitive Skin Lotion  Nivea Body Creamy Conditioning Oil  Nivea Body Extra Enriched Lotion  Nivea Body Original Lotion  Nivea Body Sheer Moisturizing Lotion Nivea Crme  Nivea Skin Firming Lotion  NutraDerm 30 Skin Lotion  NutraDerm Skin Lotion  NutraDerm Therapeutic Skin Cream  NutraDerm Therapeutic Skin Lotion  ProShield Protective Hand Cream  Provon moisturizing lotion  View Pre-Surgery Education Videos:  [email or web address]     Incentive Spirometer  An incentive spirometer is a tool that can help keep your lungs clear and active. This tool measures how well you are filling your lungs with each breath. Taking long deep breaths may help reverse or decrease the chance of developing breathing (pulmonary) problems (especially infection) following: A long period of time when you are unable to move or be active. BEFORE THE PROCEDURE  If the spirometer includes an indicator to show your best effort, your nurse or respiratory therapist will set it to a desired goal. If possible, sit up straight or lean slightly forward. Try not  to slouch. Hold the incentive spirometer in an upright position. INSTRUCTIONS FOR USE  Sit on the edge of your bed if possible, or sit up as far as you can in bed or on a chair. Hold the incentive spirometer in an upright position. Breathe out normally. Place the mouthpiece in your mouth and seal your lips tightly around it. Breathe in slowly and as deeply as possible, raising the piston or the ball toward the top of the column. Hold your breath for 3-5 seconds or for as long as possible. Allow the piston or ball to fall to the bottom of the column. Remove the mouthpiece from your mouth and breathe out normally. Rest for a  few seconds and repeat Steps 1 through 7 at least 10 times every 1-2 hours when you are awake. Take your time and take a few normal breaths between deep breaths. The spirometer may include an indicator to show your best effort. Use the indicator as a goal to work toward during each repetition. After each set of 10 deep breaths, practice coughing to be sure your lungs are clear. If you have an incision (the cut made at the time of surgery), support your incision when coughing by placing a pillow or rolled up towels firmly against it. Once you are able to get out of bed, walk around indoors and cough well. You may stop using the incentive spirometer when instructed by your caregiver.  RISKS AND COMPLICATIONS Take your time so you do not get dizzy or light-headed. If you are in pain, you may need to take or ask for pain medication before doing incentive spirometry. It is harder to take a deep breath if you are having pain. AFTER USE Rest and breathe slowly and easily. It can be helpful to keep track of a log of your progress. Your caregiver can provide you with a simple table to help with this. If you are using the spirometer at home, follow these instructions: SEEK MEDICAL CARE IF:  You are having difficultly using the spirometer. You have trouble using the spirometer as often as instructed. Your pain medication is not giving enough relief while using the spirometer. You develop fever of 100.5 F (38.1 C) or higher. SEEK IMMEDIATE MEDICAL CARE IF:  You cough up bloody sputum that had not been present before. You develop fever of 102 F (38.9 C) or greater. You develop worsening pain at or near the incision site. MAKE SURE YOU:  Understand these instructions. Will watch your condition. Will get help right away if you are not doing well or get worse. Document Released: 07/29/2006 Document Revised: 06/10/2011 Document Reviewed: 09/29/2006 [facility] Patient Information 2014 [place],  [place].   ________________________________________________________________________

## 2024-05-07 ENCOUNTER — Encounter (HOSPITAL_COMMUNITY): Payer: Self-pay

## 2024-05-07 ENCOUNTER — Other Ambulatory Visit: Payer: Self-pay

## 2024-05-07 ENCOUNTER — Encounter (HOSPITAL_COMMUNITY): Admission: RE | Admit: 2024-05-07

## 2024-05-07 VITALS — BP 147/85 | HR 54 | Temp 98.4°F | Resp 16 | Ht 66.0 in | Wt 132.0 lb

## 2024-05-07 DIAGNOSIS — Z01818 Encounter for other preprocedural examination: Secondary | ICD-10-CM

## 2024-05-07 DIAGNOSIS — I10 Essential (primary) hypertension: Secondary | ICD-10-CM

## 2024-05-07 HISTORY — DX: Unspecified asthma, uncomplicated: J45.909

## 2024-05-07 HISTORY — DX: Headache, unspecified: R51.9

## 2024-05-07 LAB — CBC
HCT: 38.9 % (ref 36.0–46.0)
Hemoglobin: 13.4 g/dL (ref 12.0–15.0)
MCH: 32.7 pg (ref 26.0–34.0)
MCHC: 34.4 g/dL (ref 30.0–36.0)
MCV: 94.9 fL (ref 80.0–100.0)
Platelets: 386 10*3/uL (ref 150–400)
RBC: 4.1 MIL/uL (ref 3.87–5.11)
RDW: 12.9 % (ref 11.5–15.5)
WBC: 4.2 10*3/uL (ref 4.0–10.5)
nRBC: 2.4 % — ABNORMAL HIGH (ref 0.0–0.2)

## 2024-05-07 LAB — SURGICAL PCR SCREEN
MRSA, PCR: NEGATIVE
Staphylococcus aureus: NEGATIVE

## 2024-05-07 LAB — BASIC METABOLIC PANEL WITH GFR
Anion gap: 10 (ref 5–15)
BUN: 16 mg/dL (ref 8–23)
CO2: 26 mmol/L (ref 22–32)
Calcium: 9.5 mg/dL (ref 8.9–10.3)
Chloride: 104 mmol/L (ref 98–111)
Creatinine, Ser: 0.68 mg/dL (ref 0.44–1.00)
GFR, Estimated: >60 mL/min
Glucose, Bld: 93 mg/dL (ref 70–99)
Potassium: 4.6 mmol/L (ref 3.5–5.1)
Sodium: 139 mmol/L (ref 135–145)

## 2024-05-18 ENCOUNTER — Encounter (HOSPITAL_COMMUNITY): Payer: Self-pay | Admitting: Medical

## 2024-05-18 ENCOUNTER — Ambulatory Visit (HOSPITAL_COMMUNITY): Admission: RE | Admit: 2024-05-18 | Source: Ambulatory Visit | Admitting: Orthopedic Surgery

## 2024-05-18 ENCOUNTER — Encounter (HOSPITAL_COMMUNITY): Admission: RE | Payer: Self-pay | Source: Ambulatory Visit

## 2024-05-18 SURGERY — ARTHROPLASTY, KNEE, TOTAL
Anesthesia: Spinal | Site: Knee | Laterality: Left

## 2024-07-01 ENCOUNTER — Ambulatory Visit: Admitting: Emergency Medicine
